# Patient Record
Sex: Female | Born: 1970 | Race: White | Hispanic: No | Smoking: Never smoker
Health system: Southern US, Community
[De-identification: ages and names within clinical notes are randomized; demographics above are authoritative.]

## PROBLEM LIST (undated history)

## (undated) DIAGNOSIS — O24419 Gestational diabetes mellitus in pregnancy, unspecified control: Secondary | ICD-10-CM

## (undated) DIAGNOSIS — K219 Gastro-esophageal reflux disease without esophagitis: Secondary | ICD-10-CM

## (undated) DIAGNOSIS — J309 Allergic rhinitis, unspecified: Secondary | ICD-10-CM

## (undated) DIAGNOSIS — R7302 Impaired glucose tolerance (oral): Secondary | ICD-10-CM

## (undated) DIAGNOSIS — E785 Hyperlipidemia, unspecified: Secondary | ICD-10-CM

## (undated) DIAGNOSIS — R000 Tachycardia, unspecified: Secondary | ICD-10-CM

## (undated) DIAGNOSIS — F411 Generalized anxiety disorder: Secondary | ICD-10-CM

## (undated) HISTORY — DX: Impaired glucose tolerance (oral): R73.02

## (undated) HISTORY — DX: Allergic rhinitis, unspecified: J30.9

## (undated) HISTORY — DX: Hyperlipidemia, unspecified: E78.5

## (undated) HISTORY — PX: TONSILLECTOMY AND ADENOIDECTOMY: SUR1326

## (undated) HISTORY — DX: Generalized anxiety disorder: F41.1

## (undated) HISTORY — DX: Gestational diabetes mellitus in pregnancy, unspecified control: O24.419

## (undated) HISTORY — DX: Gastro-esophageal reflux disease without esophagitis: K21.9

## (undated) HISTORY — DX: Tachycardia, unspecified: R00.0

---

## 1991-01-22 HISTORY — PX: NASAL SINUS SURGERY: SHX719

## 1998-11-30 ENCOUNTER — Ambulatory Visit (HOSPITAL_COMMUNITY): Admission: RE | Admit: 1998-11-30 | Discharge: 1998-11-30 | Payer: Self-pay | Admitting: Internal Medicine

## 1998-11-30 ENCOUNTER — Encounter: Payer: Self-pay | Admitting: Internal Medicine

## 1999-10-31 ENCOUNTER — Other Ambulatory Visit: Admission: RE | Admit: 1999-10-31 | Discharge: 1999-10-31 | Payer: Self-pay | Admitting: Internal Medicine

## 2004-06-19 ENCOUNTER — Ambulatory Visit: Payer: Self-pay | Admitting: Internal Medicine

## 2004-10-02 ENCOUNTER — Ambulatory Visit: Payer: Self-pay | Admitting: Internal Medicine

## 2004-11-19 ENCOUNTER — Ambulatory Visit: Payer: Self-pay | Admitting: Internal Medicine

## 2005-08-05 ENCOUNTER — Ambulatory Visit: Payer: Self-pay | Admitting: Internal Medicine

## 2006-01-27 ENCOUNTER — Encounter: Admission: RE | Admit: 2006-01-27 | Discharge: 2006-04-04 | Payer: Self-pay | Admitting: *Deleted

## 2006-04-05 ENCOUNTER — Inpatient Hospital Stay (HOSPITAL_COMMUNITY): Admission: AD | Admit: 2006-04-05 | Discharge: 2006-04-08 | Payer: Self-pay | Admitting: Obstetrics & Gynecology

## 2006-04-05 ENCOUNTER — Encounter (INDEPENDENT_AMBULATORY_CARE_PROVIDER_SITE_OTHER): Payer: Self-pay | Admitting: Specialist

## 2006-04-09 ENCOUNTER — Encounter: Admission: RE | Admit: 2006-04-09 | Discharge: 2006-05-09 | Payer: Self-pay | Admitting: *Deleted

## 2006-05-10 ENCOUNTER — Encounter: Admission: RE | Admit: 2006-05-10 | Discharge: 2006-05-21 | Payer: Self-pay | Admitting: *Deleted

## 2006-09-01 ENCOUNTER — Ambulatory Visit: Payer: Self-pay | Admitting: Internal Medicine

## 2006-09-01 DIAGNOSIS — E669 Obesity, unspecified: Secondary | ICD-10-CM | POA: Insufficient documentation

## 2006-09-01 DIAGNOSIS — J309 Allergic rhinitis, unspecified: Secondary | ICD-10-CM | POA: Insufficient documentation

## 2006-09-01 DIAGNOSIS — F411 Generalized anxiety disorder: Secondary | ICD-10-CM

## 2006-09-01 DIAGNOSIS — E739 Lactose intolerance, unspecified: Secondary | ICD-10-CM

## 2006-09-01 DIAGNOSIS — R519 Headache, unspecified: Secondary | ICD-10-CM | POA: Insufficient documentation

## 2006-09-01 DIAGNOSIS — R51 Headache: Secondary | ICD-10-CM

## 2006-09-01 LAB — CONVERTED CEMR LAB
ALT: 18 units/L (ref 0–35)
AST: 18 units/L (ref 0–37)
Albumin: 3.9 g/dL (ref 3.5–5.2)
Alkaline Phosphatase: 58 units/L (ref 39–117)
BUN: 8 mg/dL (ref 6–23)
Basophils Absolute: 0.1 10*3/uL (ref 0.0–0.1)
Basophils Relative: 0.8 % (ref 0.0–1.0)
Bilirubin Urine: NEGATIVE
Bilirubin, Direct: 0.1 mg/dL (ref 0.0–0.3)
CO2: 32 meq/L (ref 19–32)
Calcium: 9.5 mg/dL (ref 8.4–10.5)
Chloride: 101 meq/L (ref 96–112)
Cholesterol: 228 mg/dL (ref 0–200)
Creatinine, Ser: 0.5 mg/dL (ref 0.4–1.2)
Direct LDL: 142.5 mg/dL
Eosinophils Absolute: 0.7 10*3/uL — ABNORMAL HIGH (ref 0.0–0.6)
Eosinophils Relative: 6.7 % — ABNORMAL HIGH (ref 0.0–5.0)
GFR calc Af Amer: 180 mL/min
GFR calc non Af Amer: 148 mL/min
Glucose, Bld: 103 mg/dL — ABNORMAL HIGH (ref 70–99)
HCT: 40.3 % (ref 36.0–46.0)
HDL: 52.4 mg/dL (ref >39.0)
Hemoglobin, Urine: NEGATIVE
Hemoglobin: 13.8 g/dL (ref 12.0–15.0)
Ketones, ur: NEGATIVE mg/dL
Leukocytes, UA: NEGATIVE
Lymphocytes Relative: 20.3 % (ref 12.0–46.0)
MCHC: 34.3 g/dL (ref 30.0–36.0)
MCV: 86.3 fL (ref 78.0–100.0)
Monocytes Absolute: 0.8 10*3/uL — ABNORMAL HIGH (ref 0.2–0.7)
Monocytes Relative: 8.1 % (ref 3.0–11.0)
Neutro Abs: 6.3 10*3/uL (ref 1.4–7.7)
Neutrophils Relative %: 64.1 % (ref 43.0–77.0)
Nitrite: NEGATIVE
Platelets: 342 10*3/uL (ref 150–400)
Potassium: 3.6 meq/L (ref 3.5–5.1)
RBC: 4.67 M/uL (ref 3.87–5.11)
RDW: 12.3 % (ref 11.5–14.6)
Sodium: 141 meq/L (ref 135–145)
Specific Gravity, Urine: 1.015 (ref 1.000–1.03)
TSH: 1.44 microintl units/mL (ref 0.35–5.50)
Total Bilirubin: 0.6 mg/dL (ref 0.3–1.2)
Total CHOL/HDL Ratio: 4.4
Total Protein, Urine: NEGATIVE mg/dL
Total Protein: 7.2 g/dL (ref 6.0–8.3)
Triglycerides: 135 mg/dL (ref 0–149)
Urine Glucose: NEGATIVE mg/dL
Urobilinogen, UA: 0.2 (ref 0.0–1.0)
VLDL: 27 mg/dL (ref 0–40)
WBC: 9.9 10*3/uL (ref 4.5–10.5)
pH: 6.5 (ref 5.0–8.0)

## 2006-09-08 ENCOUNTER — Ambulatory Visit: Payer: Self-pay | Admitting: Internal Medicine

## 2006-10-27 ENCOUNTER — Encounter: Admission: RE | Admit: 2006-10-27 | Discharge: 2006-10-27 | Payer: Self-pay | Admitting: *Deleted

## 2006-11-20 ENCOUNTER — Encounter: Payer: Self-pay | Admitting: Internal Medicine

## 2006-11-20 ENCOUNTER — Ambulatory Visit: Payer: Self-pay | Admitting: Internal Medicine

## 2006-11-20 DIAGNOSIS — H6690 Otitis media, unspecified, unspecified ear: Secondary | ICD-10-CM | POA: Insufficient documentation

## 2006-11-20 DIAGNOSIS — J019 Acute sinusitis, unspecified: Secondary | ICD-10-CM

## 2007-03-19 ENCOUNTER — Ambulatory Visit: Payer: Self-pay | Admitting: Internal Medicine

## 2007-04-09 ENCOUNTER — Ambulatory Visit: Payer: Self-pay | Admitting: Internal Medicine

## 2007-04-09 DIAGNOSIS — E785 Hyperlipidemia, unspecified: Secondary | ICD-10-CM | POA: Insufficient documentation

## 2007-04-09 LAB — CONVERTED CEMR LAB: Hgb A1c MFr Bld: 5.8 % (ref 4.6–6.0)

## 2007-06-09 ENCOUNTER — Encounter: Payer: Self-pay | Admitting: Internal Medicine

## 2007-07-01 ENCOUNTER — Ambulatory Visit: Payer: Self-pay | Admitting: Internal Medicine

## 2007-07-01 DIAGNOSIS — R002 Palpitations: Secondary | ICD-10-CM

## 2007-07-09 ENCOUNTER — Telehealth: Payer: Self-pay | Admitting: Internal Medicine

## 2007-08-10 ENCOUNTER — Ambulatory Visit: Payer: Self-pay | Admitting: Internal Medicine

## 2007-10-12 ENCOUNTER — Ambulatory Visit: Payer: Self-pay | Admitting: Internal Medicine

## 2007-10-29 ENCOUNTER — Encounter: Admission: RE | Admit: 2007-10-29 | Discharge: 2007-10-29 | Payer: Self-pay | Admitting: Obstetrics & Gynecology

## 2007-11-06 ENCOUNTER — Telehealth (INDEPENDENT_AMBULATORY_CARE_PROVIDER_SITE_OTHER): Payer: Self-pay | Admitting: *Deleted

## 2007-11-16 ENCOUNTER — Ambulatory Visit: Payer: Self-pay | Admitting: Internal Medicine

## 2008-05-30 ENCOUNTER — Ambulatory Visit: Payer: Self-pay | Admitting: Internal Medicine

## 2008-06-21 LAB — CONVERTED CEMR LAB: Pap Smear: NORMAL

## 2008-07-26 ENCOUNTER — Ambulatory Visit: Payer: Self-pay | Admitting: Internal Medicine

## 2008-07-26 LAB — CONVERTED CEMR LAB
Albumin: 3.7 g/dL (ref 3.5–5.2)
BUN: 7 mg/dL (ref 6–23)
Basophils Relative: 0.6 % (ref 0.0–3.0)
Bilirubin, Direct: 0.1 mg/dL (ref 0.0–0.3)
CO2: 30 meq/L (ref 19–32)
Chloride: 105 meq/L (ref 96–112)
Cholesterol: 200 mg/dL (ref 0–200)
Creatinine, Ser: 0.6 mg/dL (ref 0.4–1.2)
Eosinophils Absolute: 0.4 10*3/uL (ref 0.0–0.7)
Hemoglobin, Urine: NEGATIVE
Leukocytes, UA: NEGATIVE
MCHC: 35.2 g/dL (ref 30.0–36.0)
MCV: 86.9 fL (ref 78.0–100.0)
Monocytes Absolute: 0.5 10*3/uL (ref 0.1–1.0)
Neutrophils Relative %: 62.9 % (ref 43.0–77.0)
Nitrite: NEGATIVE
RBC: 4.28 M/uL (ref 3.87–5.11)
TSH: 1.6 microintl units/mL (ref 0.35–5.50)
Total CHOL/HDL Ratio: 4
Total Protein, Urine: NEGATIVE mg/dL
Total Protein: 7.1 g/dL (ref 6.0–8.3)
Triglycerides: 61 mg/dL (ref 0.0–149.0)
Urobilinogen, UA: 0.2 (ref 0.0–1.0)

## 2008-08-01 ENCOUNTER — Ambulatory Visit: Payer: Self-pay | Admitting: Internal Medicine

## 2008-08-01 DIAGNOSIS — R079 Chest pain, unspecified: Secondary | ICD-10-CM | POA: Insufficient documentation

## 2008-08-12 ENCOUNTER — Ambulatory Visit: Payer: Self-pay | Admitting: Internal Medicine

## 2008-08-16 ENCOUNTER — Telehealth (INDEPENDENT_AMBULATORY_CARE_PROVIDER_SITE_OTHER): Payer: Self-pay | Admitting: *Deleted

## 2008-08-31 ENCOUNTER — Ambulatory Visit: Payer: Self-pay | Admitting: Internal Medicine

## 2008-10-31 ENCOUNTER — Encounter: Admission: RE | Admit: 2008-10-31 | Discharge: 2008-10-31 | Payer: Self-pay | Admitting: Obstetrics & Gynecology

## 2009-02-15 ENCOUNTER — Telehealth: Payer: Self-pay | Admitting: Internal Medicine

## 2009-02-16 ENCOUNTER — Ambulatory Visit: Payer: Self-pay | Admitting: Internal Medicine

## 2009-02-16 DIAGNOSIS — K644 Residual hemorrhoidal skin tags: Secondary | ICD-10-CM | POA: Insufficient documentation

## 2009-11-01 ENCOUNTER — Encounter: Admission: RE | Admit: 2009-11-01 | Discharge: 2009-11-01 | Payer: Self-pay | Admitting: Obstetrics & Gynecology

## 2010-01-10 ENCOUNTER — Ambulatory Visit: Payer: Self-pay | Admitting: Internal Medicine

## 2010-01-16 LAB — CONVERTED CEMR LAB
ALT: 12 units/L (ref 0–35)
AST: 14 units/L (ref 0–37)
Basophils Absolute: 0 10*3/uL (ref 0.0–0.1)
Bilirubin, Direct: 0.1 mg/dL (ref 0.0–0.3)
Blood, UA: NEGATIVE
Chloride: 105 meq/L (ref 96–112)
Creatinine, Ser: 0.5 mg/dL (ref 0.4–1.2)
Eosinophils Absolute: 0.5 10*3/uL (ref 0.0–0.7)
GFR calc non Af Amer: 133.27 mL/min (ref >60.00)
HDL: 43.9 mg/dL (ref >39.00)
Lymphocytes Relative: 25.1 % (ref 12.0–46.0)
MCHC: 34.7 g/dL (ref 30.0–36.0)
Neutrophils Relative %: 61.6 % (ref 43.0–77.0)
Potassium: 3.8 meq/L (ref 3.5–5.1)
RDW: 12.8 % (ref 11.5–14.6)
TSH: 1.64 microintl units/mL (ref 0.35–5.50)
Total Bilirubin: 0.4 mg/dL (ref 0.3–1.2)
Total Protein, Urine: NEGATIVE mg/dL
Triglycerides: 61 mg/dL (ref 0.0–149.0)
Urine Glucose: NEGATIVE mg/dL
VLDL: 12.2 mg/dL (ref 0.0–40.0)
pH: 6.5 (ref 5.0–8.0)

## 2010-01-17 ENCOUNTER — Ambulatory Visit: Payer: Self-pay | Admitting: Internal Medicine

## 2010-01-17 DIAGNOSIS — K219 Gastro-esophageal reflux disease without esophagitis: Secondary | ICD-10-CM

## 2010-02-20 NOTE — Progress Notes (Signed)
Summary: Stool sample  Phone Note Call from Patient   Caller: Patient [phone] Summary of Call: pt called stating that she has appt with MD tomorrow for rectal bleeding. pt wants to know if MD wants her to have a stool sample done prior to appt? Initial call taken by: Margaret Pyle, CMA,  February 15, 2009 3:35 PM  Follow-up for Phone Call        pt called back and I advised her that MD could  put in order for her to pick up specimen cup, but given the time of day she may not be able to get her in time. pt agreed and stated that she will get cup if MD okay'd it at [facility] tomorrow Follow-up by: Margaret Pyle, CMA,  February 15, 2009 4:08 PM

## 2010-02-20 NOTE — Assessment & Plan Note (Signed)
Summary: rectal bleed/leaving triage message/cd   Vital Signs:  Patient profile:   40 year old female Height:      60 inches Weight:      166 pounds BMI:     32.54 O2 Sat:      98 % on Room air Temp:     97.6 degrees F oral Pulse rate:   84 / minute BP sitting:   100 / 76  (left arm) Cuff size:   regular  Vitals Entered ByZella Ball Ewing (February 16, 2009 4:07 PM)  O2 Flow:  Room air CC: rectal bleeding/RE   Primary Care Provider:  Corwin Levins MD  CC:  rectal bleeding/RE.  History of Present Illness: here with 4 days recurrent small volume BRBPR (not menstrual blood) with blood on tissue and bowl but not clear if in stool;  seemed to start with an episode of loose stool, but has o/w been normal stool consistency since;  has had some anal discomfort and mild aching that has been persistent, but no other new  back pain, abd pain, n/v, fever, chills, recent wt loss, joint pains, dizziness or orthostasis.Marland Kitchen LMP now.  No GU symptoms such as dysuria or frequency.  No prior hx GI problems or BRBPR.  denies recent constipation or straining.  No other overt bleeding such as bruising, urine, oral, and no recnet asa use.  No recent anemia - last hgb normal july 2010.    Problems Prior to Update: 1)  Hematochezia  (ICD-578.1) 2)  External Hemorrhoids  (ICD-455.3) 3)  Chest Pain  (ICD-786.50) 4)  Preventive Health Care  (ICD-V70.0) 5)  Sinusitis- Acute-nos  (ICD-461.9) 6)  Palpitations, Recurrent  (ICD-785.1) 7)  Hyperlipidemia  (ICD-272.4) 8)  Sinusitis- Acute-nos  (ICD-461.9) 9)  Otitis Media, Acute, Left  (ICD-382.9) 10)  Family History Breast Cancer 1st Degree Relative <50  (ICD-V16.3) 11)  Abnormal Findings, Elevated Bp w/o Htn  (ICD-796.2) 12)  Glucose Intolerance  (ICD-271.3) 13)  Obesity  (ICD-278.00) 14)  Headache  (ICD-784.0) 15)  Anxiety  (ICD-300.00) 16)  Allergic Rhinitis  (ICD-477.9)  Medications Prior to Update: 1)  Metoprolol Tartrate 25 Mg Tabs (Metoprolol Tartrate)  .... Take 1/2 Tab Two Times A Day 2)  Tussionex Pennkinetic Er 8-10 Mg/71ml Lqcr (Chlorpheniramine-Hydrocodone) .... 2.5-5 Ml By Mouth Two Times A Day As Needed Cough 3)  Blood Pressure Monitor .... Use Asd 4)  Alprazolam 0.25 Mg Tabs (Alprazolam) .Marland Kitchen.. 1 By Mouth Once Daily As Needed  Current Medications (verified): 1)  Metoprolol Tartrate 25 Mg Tabs (Metoprolol Tartrate) .... Take 1/2 Tab Two Times A Day 2)  Tussionex Pennkinetic Er 8-10 Mg/42ml Lqcr (Chlorpheniramine-Hydrocodone) .... 2.5-5 Ml By Mouth Two Times A Day As Needed Cough 3)  Blood Pressure Monitor .... Use Asd 4)  Alprazolam 0.25 Mg Tabs (Alprazolam) .Marland Kitchen.. 1 By Mouth Once Daily As Needed 5)  Hydrocortisone Ace-Pramoxine 2.5-1 % Crea (Hydrocortisone Ace-Pramoxine) .... Use Asd Two Times A Day As Needed To Affected Area  Allergies (verified): 1)  ! Sulfa 2)  ! Cipro  Past History:  Past Medical History: Last updated: 08/01/2008 Allergic rhinitis Anxiety Headache Obesity Glucose Intolerance Increased blood pressure without HTN Hyperlipidemia palpitations  probable benign ovary cyst - followed per GYN - due for f/u u/s dec 2010  Past Surgical History: Last updated: 04/09/2007 Tonsillectomy- 1982 Septoplasty- 1993 Tubal ligation  Social History: Last updated: 04/09/2007 Never Smoked Alcohol use-no Married 2 biol children  Risk Factors: Smoking Status: never (11/20/2006)  Family History: Reviewed  history from 11/20/2006 and no changes required. Family History Breast cancer 1st degree relative <50 no colon cancer  Review of Systems       all otherwise negative per pt -  Physical Exam  General:  alert and overweight-appearing.  , not ill appearing Head:  normocephalic and atraumatic.   Eyes:  vision grossly intact, pupils equal, and pupils round.   Ears:  R ear normal and L ear normal.   Nose:  no external deformity and no nasal discharge.   Mouth:  no gingival abnormalities and pharynx pink and  moist.   Neck:  supple and no masses.   Lungs:  normal respiratory effort and normal breath sounds.   Heart:  normal rate and regular rhythm.   Rectal:  new anal ext hemorrhoid noted 9oclock,  no active bleeding, no fissure, small tag noted as well approx 6oclock, heme neg  Msk:  no joint tenderness and no joint swelling.   Extremities:  no edema, no erythema    Impression & Recommendations:  Problem # 1:  EXTERNAL HEMORRHOIDS (ICD-455.3) tx with steroid cream as needed use, non thrombosed at this time  Problem # 2:  HEMATOCHEZIA (ICD-578.1)  most likely from above, but will need direct sched colonosocpy to r/o malignancy or other  Orders: Gastroenterology Referral (GI)  Complete Medication List: 1)  Metoprolol Tartrate 25 Mg Tabs (Metoprolol tartrate) .... Take 1/2 tab two times a day 2)  Tussionex Pennkinetic Er 8-10 Mg/46ml Lqcr (Chlorpheniramine-hydrocodone) .... 2.5-5 ml by mouth two times a day as needed cough 3)  Blood Pressure Monitor  .... Use asd 4)  Alprazolam 0.25 Mg Tabs (Alprazolam) .Marland Kitchen.. 1 by mouth once daily as needed 5)  Hydrocortisone Ace-pramoxine 2.5-1 % Crea (Hydrocortisone ace-pramoxine) .... Use asd two times a day as needed to affected area  Patient Instructions: 1)  Please take all new medications as prescribed 2)  Continue all previous medications as before this visit  3)  You will be contacted about the referral(s) to: colonoscopy 4)  Please schedule a follow-up appointment in July 2011 with CPX labs Prescriptions: HYDROCORTISONE ACE-PRAMOXINE 2.5-1 % CREA (HYDROCORTISONE ACE-PRAMOXINE) use asd two times a day as needed to affected area  #1 x 1   Entered and Authorized by:   Corwin Levins MD   Signed by:   Corwin Levins MD on 02/16/2009   Method used:   Print then Give to Patient   RxID:   [id number]

## 2010-02-22 NOTE — Assessment & Plan Note (Signed)
Summary: CPX / NWS   Vital Signs:  Patient profile:   40 year old female Height:      61 inches Weight:      165.38 pounds BMI:     31.36 O2 Sat:      97 % on Room air Temp:     97.9 degrees F oral Pulse rate:   97 / minute BP sitting:   100 / 62  (left arm) Cuff size:   regular  Vitals Entered By: Zella Ball Ewing CMA Duncan Dull) (January 17, 2010 8:21 AM)  O2 Flow:  Room air  Preventive Care Screening  Pap Smear:    Date:  06/21/2009    Results:  normal      had flu shot earlier this season   CC: ADult Physical/RE   Primary Care Abeni Finchum:  Corwin Levins MD  CC:  ADult Physical/RE.  History of Present Illness: here for wellness, overall doing ok;  Pt denies CP, worsening sob, doe, wheezing, orthopnea, pnd, worsening LE edema, palps, dizziness or syncope  Pt denies new neuro symptoms such as headache, facial or extremity weakness  Pt denies polydipsia, polyuria   Overall good compliance with meds, trying to follow low chol diet, wt stable, little excercise however.  No fever, unintentional wt loss, night sweats, loss of appetite or other constitutional symptoms  Overall good compliance with meds, and good tolerability.  Pt states good ability with ADL's, low fall risk, home safety reviewed and adequate, no significant change in hearing or vision, trying to follow lower chol diet, and occasionally active only with regular excercise.  Lost 4 lbs since last yr;  more stress with separation and upcoming divorce;   Denies worsening depressive symptoms, suicidal ideation, or panic.  She is concerned that has had achy symtpoms in past yr - mostly to the hands and shoulder;  grandmother with RA; there was some concern  about lupus in the past but no dx;  she declines labs today since it is very mild and no swelling.    Preventive Screening-Counseling & Management      Drug Use:  no.    Problems Prior to Update: 1)  Hematochezia  (ICD-578.1) 2)  External Hemorrhoids  (ICD-455.3) 3)  Chest  Pain  (ICD-786.50) 4)  Preventive Health Care  (ICD-V70.0) 5)  Sinusitis- Acute-nos  (ICD-461.9) 6)  Palpitations, Recurrent  (ICD-785.1) 7)  Hyperlipidemia  (ICD-272.4) 8)  Sinusitis- Acute-nos  (ICD-461.9) 9)  Otitis Media, Acute, Left  (ICD-382.9) 10)  Family History Breast Cancer 1st Degree Relative <50  (ICD-V16.3) 11)  Abnormal Findings, Elevated Bp w/o Htn  (ICD-796.2) 12)  Glucose Intolerance  (ICD-271.3) 13)  Obesity  (ICD-278.00) 14)  Headache  (ICD-784.0) 15)  Anxiety  (ICD-300.00) 16)  Allergic Rhinitis  (ICD-477.9)  Medications Prior to Update: 1)  Metoprolol Tartrate 25 Mg Tabs (Metoprolol Tartrate) .... Take 1/2 Tab Two Times A Day 2)  Tussionex Pennkinetic Er 8-10 Mg/73ml Lqcr (Chlorpheniramine-Hydrocodone) .... 2.5-5 Ml By Mouth Two Times A Day As Needed Cough 3)  Blood Pressure Monitor .... Use Asd 4)  Alprazolam 0.25 Mg Tabs (Alprazolam) .Marland Kitchen.. 1 By Mouth Once Daily As Needed 5)  Hydrocortisone Ace-Pramoxine 2.5-1 % Crea (Hydrocortisone Ace-Pramoxine) .... Use Asd Two Times A Day As Needed To Affected Area  Current Medications (verified): 1)  Metoprolol Tartrate 25 Mg Tabs (Metoprolol Tartrate) .... Take 1/2 Tab Two Times A Day  Allergies (verified): 1)  ! Sulfa 2)  ! 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 Family History Breast cancer 1st degree relative <50 no colon cancer pateranl side - "arthritis"    Social History: Reviewed history from 04/09/2007 and no changes required. Never Smoked Alcohol use-no Married but divorcing 2012 2 biol children Drug use-no work - Runner, broadcasting/film/video - 6th grade language artsDrug Use:  no  Review of Systems  The patient denies anorexia, fever, vision loss, decreased hearing, hoarseness, chest pain, syncope, dyspnea on exertion, peripheral edema, prolonged cough, headaches, hemoptysis, abdominal pain, melena, hematochezia, severe indigestion/heartburn, hematuria, muscle weakness, suspicious skin lesions, transient blindness, difficulty walking, depression, unusual weight change, abnormal bleeding, enlarged lymph nodes, and angioedema.         all otherwise negative per pt -    Physical Exam  General:  alert and overweight-appearing.   Head:  normocephalic and atraumatic.   Eyes:  vision grossly intact, pupils equal, and pupils round.   Ears:  R ear normal and L ear normal.   Nose:  no external deformity and no nasal discharge.   Mouth:  no gingival abnormalities and pharynx pink and moist.   Neck:  supple and no masses.   Lungs:  normal respiratory effort and normal breath sounds.   Heart:  normal rate and regular rhythm.   Abdomen:  soft, non-tender, and normal bowel sounds.   Msk:  no joint tenderness and no joint swelling.   Extremities:  no edema, no erythema  Neurologic:  strength normal in all extremities, sensation intact to light touch, gait normal, and DTRs symmetrical and normal.   Skin:  color normal and no rashes.   Psych:  not depressed appearing and slightly anxious.     Impression & Recommendations:  Problem # 1:  Preventive Health Care (ICD-V70.0) Overall doing well, age appropriate education and counseling updated, referral for  preventive services and immunizations addressed, dietary counseling and smoking status adressed , most recent labs reviewed I have personally reviewed and have noted 1.The patient's medical and social history 2.Their use of alcohol, tobacco or illicit drugs 3.Their current medications and supplements 4. Functional ability including ADL's, fall risk, home safety risk, hearing & visual impairment  5.Diet and physical activities 6.Evidence for depression or mood disorders The patients weight, height, BMI  have been recorded in the chart I have made referrals, counseling and provided education to the patient based review of the above   Problem # 2:  HYPERLIPIDEMIA (ICD-272.4)  Labs Reviewed: SGOT: 14 (01/16/2010)   SGPT: 12 (01/16/2010)   HDL:43.90 (01/16/2010), 55.80 (07/26/2008)  LDL:125 (01/16/2010), 132 (07/26/2008)  Chol:181 (01/16/2010), 200 (07/26/2008)  Trig:61.0 (01/16/2010), 61.0 (07/26/2008) d/w pt - declines statin, to follow lower chol diet  Problem # 3:  PAIN IN JOINT, MULTIPLE SITES (ICD-719.49) exam benign, ok for tylenol as needed   Complete Medication List: 1)  Metoprolol Tartrate 25 Mg Tabs (Metoprolol tartrate) .Marland KitchenMarland KitchenMarland Kitchen  Take 1/2 tab two times a day  Patient Instructions: 1)  you are given the refill today 2)  You are otherwise up to date 3)  Please follow lower cholesterol diet 4)  Please schedule a follow-up appointment in 1 year, or sooner if needed Prescriptions: METOPROLOL TARTRATE 25 MG TABS (METOPROLOL TARTRATE) take 1/2 tab two times a day  #90 x 3   Entered and Authorized by:   Corwin Levins MD   Signed by:   Corwin Levins MD on 01/17/2010   Method used:   Print then Give to Patient   RxID:   [id number]    Orders Added: 1)  Est. Patient 18-39 years [[id number]]

## 2010-06-08 NOTE — Discharge Summary (Signed)
NAMELAKECHIA, [name]                ACCOUNT NO.:  [id number]   MEDICAL RECORD NO.:  [id number]          PATIENT TYPE:  INP   LOCATION:  9123                          FACILITY:  [facility]   PHYSICIAN:  Genia Del, M.D.DATE OF BIRTH:  15-Nov-1970   DATE OF ADMISSION:  04/05/2006  DATE OF DISCHARGE:  04/08/2006                               DISCHARGE SUMMARY   ADMISSION DIAGNOSES:  1. Thirty-nine plus weeks gestation.  2. Spontaneous labor.  3. Previous cesarean section.  4. Desire for bilateral tubal sterilization.   DISCHARGE DIAGNOSES:  1. Thirty-nine plus weeks gestation.  2. Spontaneous labor.  3. Previous cesarean section.  4. Desire for bilateral tubal sterilization.   INTERVENTION:  Repeat urgent low transverse C-section and bilateral  tubal sterilization by modified Pomeroy technique.   HOSPITAL COURSE:  The patient was admitted in spontaneous labor.  Because of a previous C-section, the decision was made to proceed with a  repeat urgent C-section.  She also desired definitive contraception so a  bilateral tubal sterilization was done by modified Pomeroy technique.  The surgery went without problems.  She had a baby girl, Apgars 9 and  10, weight 8 pounds 14 ounces.  The estimated blood loss was 600 mL and  she received Ancef after cord clamping.  The postop evaluation was  unremarkable.  She remained afebrile and hemodynamically stable.  Her  postop hemoglobin was 9.6.  She was discharged on postop day #3 in  stable status.  Postop advice was given.  She will follow up at [facility] in 6 weeks.  She will use ibuprofen p.r.n.      Genia Del, M.D.  Electronically Signed     ML/MEDQ  D:  05/04/2006  T:  05/04/2006  Job:  [id number]

## 2010-06-08 NOTE — Op Note (Signed)
[name], [name]                ACCOUNT NO.:  [id number]   MEDICAL RECORD NO.:  [id number]          PATIENT TYPE:  MAT   LOCATION:  [facility]                          FACILITY:  [facility]   PHYSICIAN:  Genia Del, M.D.DATE OF BIRTH:  [birth year]   DATE OF PROCEDURE:  04/05/2006  DATE OF DISCHARGE:                               OPERATIVE REPORT   PREOPERATIVE DIAGNOSIS:  Thirty-nine plus weeks' gestation, spontaneous  labor with spontaneous rupture of membranes, previous C-section, desire  for bilateral tubal sterilization.   POSTOPERATIVE DIAGNOSIS:  Thirty-nine plus weeks' gestation, spontaneous  labor with spontaneous rupture of membranes, previous C-section, desire  for bilateral tubal sterilization.   PROCEDURE:  Repeat urgent low transverse C-section and bilateral tubal  sterilization by modified Pomeroy technique.   SURGEON:  Dr. Genia Del.   ANESTHESIOLOGIST:  Dr. Harvest Forest.   PROCEDURE:  Under spinal anesthesia, the patient is in 15-degree left  decubitus position.  She is prepped with Betadine on the abdominal,  suprapubic and vulvar areas.  The Foley is inserted, and the patient is  draped as usual.  The level of anesthesia is verified and is adequate.  An infiltration of Marcaine one-quarter plain 20 mL is done at the site  of the previous C-section.  We then make a Pfannenstiel incision  following the scar of the previous C-section.  We opened the aponeurosis  transversely with Mayo scissors and the electrocautery.  We separated  the recti muscles from the aponeurosis on the midline.  We opened the  parietal peritoneum longitudinally with [facility] scissors.  We then open the  visceral peritoneum over the lower uterine segment with [facility] scissors  transversely and reclined the bladder downward.  The bladder retractor  is put in place.  We then make a low transverse hysterotomy over the  lower uterine segment with the scalpel and extend on each side with  dressing  scissors.  The amniotic fluid is clear.  The fetus is in  cephalic presentation.  Birth of a baby girl at 4:20.  The cord is  clamped and cut.  The baby was given to the neonatal team.  Apgars are 9  and 10.  The weight is 8 pounds 14 ounces.  Cord blood donation is done.  We remove the placenta which is complete with three vessels.  It is sent  to labor and delivery.  A dose of Ancef 1 g IV is given.  Pitocin is  started in the IV fluid.  Revision of the intrauterine cavity, the  uterus contracts well.  We close the hysterotomy with a first locked  running suture of Vicryl 0.  A mattress stitch is done with Vicryl 0.  A  small extension was present on the left inferior aspect, and it was  repaired in 2 layers.  Hemostasis is adequate.  We then verify the tubes  and ovaries; they are normal to inspection.  The bilateral tubal  sterilization is done by a modified Pomeroy technique.  We make a window  in the mesosalpinx on the right side.  We attach with plain suture and  cut a segment of tube in the middle with [employer].  It is sent to  pathology.  We cauterize the tips of the cut tube on the right side.  Hemostasis is completed with a Vicryl 4-0 at the proximal end of the  tube.  We proceed exactly the same way on the left side.  Hemostasis is  adequate on that side as well.  We then irrigate and suction the  abdominopelvic cavity.  We complete hemostasis on the recti muscles with  the electrocautery.  We then close the aponeurosis with 2 half running  sutures of Vicryl 0.  Hemostasis is completed on the adipose tissue with  the  electrocautery, and the skin is reapproximated with staples.  A  compressive dressing is made.  The estimated blood loss is 600 mL.  The  count of sponges and instruments was complete x2.  The patient is  brought to recovery room in good stable status.      Genia Del, M.D.  Electronically Signed     ML/MEDQ  D:  04/05/2006  T:  04/05/2006   Job:  [id number]

## 2010-07-24 ENCOUNTER — Other Ambulatory Visit: Payer: Self-pay | Admitting: Obstetrics & Gynecology

## 2010-09-26 ENCOUNTER — Other Ambulatory Visit: Payer: Self-pay | Admitting: Obstetrics & Gynecology

## 2010-09-26 DIAGNOSIS — Z1231 Encounter for screening mammogram for malignant neoplasm of breast: Secondary | ICD-10-CM

## 2010-11-08 ENCOUNTER — Ambulatory Visit: Payer: Self-pay

## 2010-11-27 ENCOUNTER — Ambulatory Visit
Admission: RE | Admit: 2010-11-27 | Discharge: 2010-11-27 | Disposition: A | Discharge status: Home / Self Care | Payer: BC Managed Care – PPO | Source: Ambulatory Visit | Attending: Obstetrics & Gynecology | Admitting: Obstetrics & Gynecology

## 2010-11-27 DIAGNOSIS — Z1231 Encounter for screening mammogram for malignant neoplasm of breast: Secondary | ICD-10-CM

## 2010-12-03 ENCOUNTER — Encounter: Payer: Self-pay | Admitting: Internal Medicine

## 2010-12-03 ENCOUNTER — Ambulatory Visit (INDEPENDENT_AMBULATORY_CARE_PROVIDER_SITE_OTHER): Payer: BC Managed Care – PPO | Admitting: Internal Medicine

## 2010-12-03 VITALS — BP 110/72 | HR 101 | Temp 98.1°F | Ht 61.0 in | Wt 172.4 lb

## 2010-12-03 DIAGNOSIS — J019 Acute sinusitis, unspecified: Secondary | ICD-10-CM

## 2010-12-03 DIAGNOSIS — E119 Type 2 diabetes mellitus without complications: Secondary | ICD-10-CM | POA: Insufficient documentation

## 2010-12-03 DIAGNOSIS — E739 Lactose intolerance, unspecified: Secondary | ICD-10-CM

## 2010-12-03 DIAGNOSIS — E1165 Type 2 diabetes mellitus with hyperglycemia: Secondary | ICD-10-CM | POA: Insufficient documentation

## 2010-12-03 DIAGNOSIS — R7302 Impaired glucose tolerance (oral): Secondary | ICD-10-CM

## 2010-12-03 DIAGNOSIS — Z0000 Encounter for general adult medical examination without abnormal findings: Secondary | ICD-10-CM

## 2010-12-03 DIAGNOSIS — F411 Generalized anxiety disorder: Secondary | ICD-10-CM

## 2010-12-03 DIAGNOSIS — R7309 Other abnormal glucose: Secondary | ICD-10-CM

## 2010-12-03 MED ORDER — LEVOFLOXACIN 500 MG PO TABS: 500.0000 mg | ORAL_TABLET | Freq: Every day | ORAL | Status: AC

## 2010-12-03 NOTE — Assessment & Plan Note (Signed)
asympt - for a1c check next visit  Lab Results  Component Value Date   HGBA1C 5.8 04/09/2007

## 2010-12-03 NOTE — Assessment & Plan Note (Addendum)
Asympt, to follow symptoms of polys with acute infection,  to f/u any worsening symptoms or concerns  Lab Results  Component Value Date   HGBA1C 5.8 04/09/2007

## 2010-12-03 NOTE — Patient Instructions (Addendum)
Take all new medications as prescribed Continue all other medications as before You can also take Delsym OTC for cough, and/or Mucinex (or it's generic off brand) for congestion Please return in 6 mo with Lab testing done 3-5 days before  

## 2010-12-03 NOTE — Progress Notes (Signed)
  Subjective:    Patient ID: [name], female    DOB: 31-Oct-1970, 40 y.o.   MRN: [id number]  HPI  Here with 3 days acute onset fever, facial pain, pressure, general weakness and malaise, and greenish d/c, with slight ST, but little to no cough and Pt denies chest pain, increased sob or doe, wheezing, orthopnea, PND, increased LE swelling, palpitations, dizziness or syncope.   Pt denies polydipsia, polyuria.  Pt denies new neurological symptoms such as new headache, or facial or extremity weakness or numbness   Pt denies fever, wt loss, night sweats, loss of appetite, or other constitutional symptoms, except for symptoms during above  Denies worsening depressive symptoms, suicidal ideation, or panic, though has ongoing anxiety, not increased recently. Past Medical History  Diagnosis Date  . Impaired glucose tolerance 12/03/2010  . HYPERLIPIDEMIA 04/09/2007  . GERD 01/17/2010  . ANXIETY 09/01/2006  . ALLERGIC RHINITIS 09/01/2006   No past surgical history on file.  reports that she has never smoked. She does not have any smokeless tobacco history on file. Her alcohol and drug histories not on file. family history is not on file. Allergies  Allergen Reactions  . Ciprofloxacin   . Sulfonamide Derivatives    No current outpatient prescriptions on file prior to visit.   Review of Systems Review of Systems  Constitutional: Negative for diaphoresis and unexpected weight change.  HENT: Negative for drooling and tinnitus.   Eyes: Negative for photophobia and visual disturbance.  Respiratory: Negative for choking and stridor.   Gastrointestinal: Negative for vomiting and blood in stool.  Genitourinary: Negative for hematuria and decreased urine volume.      Objective:   Physical Exam BP 110/72  Pulse 101  Temp(Src) 98.1 F (36.7 C) (Oral)  Ht 5\' 1"  (1.549 m)  Wt 172 lb 6 oz (78.189 kg)  BMI 32.57 kg/m2  SpO2 99%  LMP 11/15/2010 Physical Exam  VS noted, mild ill Constitutional: Pt  appears well-developed and well-nourished.  HENT: Head: Normocephalic.  Right Ear: External ear normal.  Left Ear: External ear normal.  Bilat tm's mild erythema.  Sinus tender bilat.  Pharynx mild erythema Eyes: Conjunctivae and EOM are normal. Pupils are equal, round, and reactive to light.  Neck: Normal range of motion. Neck supple.  Cardiovascular: Normal rate and regular rhythm.   Pulmonary/Chest: Effort normal and breath sounds normal.  Neurological: Pt is alert. No cranial nerve deficit.  Skin: Skin is warm. No erythema.  Psychiatric: Pt behavior is normal. Thought content normal. Not nerovus or depressed affect today    Assessment & Plan:

## 2010-12-03 NOTE — Assessment & Plan Note (Signed)
stable overall by hx and exam, most recent data reviewed with pt, and pt to continue medical treatment as before  Lab Results  Component Value Date   WBC 7.4 01/16/2010   HGB 12.5 01/16/2010   HCT 36.1 01/16/2010   PLT 258.0 01/16/2010   GLUCOSE 89 01/16/2010   CHOL 181 01/16/2010   TRIG 61.0 01/16/2010   HDL 43.90 01/16/2010   LDLDIRECT 142.5 09/01/2006   LDLCALC 125* 01/16/2010   ALT 12 01/16/2010   AST 14 01/16/2010   NA 138 01/16/2010   K 3.8 01/16/2010   CL 105 01/16/2010   CREATININE 0.5 01/16/2010   BUN 8 01/16/2010   CO2 26 01/16/2010   TSH 1.64 01/16/2010   HGBA1C 5.8 04/09/2007

## 2010-12-03 NOTE — Assessment & Plan Note (Signed)
Mild to mod, for antibx course,  to f/u any worsening symptoms or concerns 

## 2010-12-05 ENCOUNTER — Telehealth: Payer: Self-pay

## 2010-12-05 NOTE — Telephone Encounter (Signed)
Patient informed. 

## 2010-12-05 NOTE — Telephone Encounter (Signed)
Ok to take tylenol, and even alleve bid prn if needed

## 2010-12-05 NOTE — Telephone Encounter (Signed)
Patient left message she still has a fever. Mornings it starts out at 101 and increases as the day goes by evening 102 to 103. She does take tylenol and it comes down. Does she need to come back in or wait it out?

## 2011-02-28 ENCOUNTER — Encounter: Payer: Self-pay | Admitting: Internal Medicine

## 2011-02-28 ENCOUNTER — Ambulatory Visit (INDEPENDENT_AMBULATORY_CARE_PROVIDER_SITE_OTHER): Payer: BC Managed Care – PPO | Admitting: Internal Medicine

## 2011-02-28 VITALS — BP 110/70 | HR 83 | Temp 97.8°F | Ht 61.0 in | Wt 172.5 lb

## 2011-02-28 DIAGNOSIS — F411 Generalized anxiety disorder: Secondary | ICD-10-CM

## 2011-02-28 DIAGNOSIS — K219 Gastro-esophageal reflux disease without esophagitis: Secondary | ICD-10-CM

## 2011-02-28 DIAGNOSIS — R7302 Impaired glucose tolerance (oral): Secondary | ICD-10-CM

## 2011-02-28 DIAGNOSIS — R7309 Other abnormal glucose: Secondary | ICD-10-CM

## 2011-02-28 DIAGNOSIS — J069 Acute upper respiratory infection, unspecified: Secondary | ICD-10-CM | POA: Insufficient documentation

## 2011-02-28 MED ORDER — AZITHROMYCIN 250 MG PO TABS: ORAL_TABLET | ORAL | Status: AC

## 2011-02-28 MED ORDER — HYDROCODONE-HOMATROPINE 5-1.5 MG/5ML PO SYRP: 5.0000 mL | ORAL_SOLUTION | Freq: Four times a day (QID) | ORAL | Status: AC | PRN

## 2011-02-28 NOTE — Progress Notes (Signed)
  Subjective:    Patient ID: [name], female    DOB: 1970/12/23, 41 y.o.   MRN: [id number]  HPI    Here with 3 days acute onset fever, facial pain, pressure, general weakness and malaise, and greenish d/c, with mod ST, and significant nonprod cough and Pt denies chest pain, increased sob or doe, wheezing, orthopnea, PND, increased LE swelling, palpitations, dizziness or syncope. Pt denies new neurological symptoms such as new headache, or facial or extremity weakness or numbness   Pt denies polydipsia, polyuria. Not pregnant.  Denies worsening reflux, dysphagia, abd pain, n/v, bowel change or blood.  Denies worsening depressive symptoms, suicidal ideation, or panic, though has ongoing anxiety, not increased recently.  Past Medical History  Diagnosis Date  . Impaired glucose tolerance 12/03/2010  . HYPERLIPIDEMIA 04/09/2007  . GERD 01/17/2010  . ANXIETY 09/01/2006  . ALLERGIC RHINITIS 09/01/2006   No past surgical history on file.  reports that she has never smoked. She does not have any smokeless tobacco history on file. Her alcohol and drug histories not on file. family history is not on file. Allergies  Allergen Reactions  . Ciprofloxacin   . Sulfonamide Derivatives    Current Outpatient Prescriptions on File Prior to Visit  Medication Sig Dispense Refill  . metoprolol tartrate (LOPRESSOR) 25 MG tablet 1/2 tablet two times daily        Review of Systems Review of Systems  Constitutional: Negative for diaphoresis and unexpected weight change.  HENT: Negative for drooling and tinnitus.   Eyes: Negative for photophobia and visual disturbance.  Respiratory: Negative for choking and stridor.   Gastrointestinal: Negative for vomiting and blood in stool.  Genitourinary: Negative for hematuria and decreased urine volume.      Objective:   Physical Exam BP 110/70  Pulse 83  Temp(Src) 97.8 F (36.6 C) (Oral)  Ht 5\' 1"  (1.549 m)  Wt 172 lb 8 oz (78.245 kg)  BMI 32.59 kg/m2  SpO2  96% Physical Exam  VS noted, mild ill Constitutional: Pt appears well-developed and well-nourished.  HENT: Head: Normocephalic.  Right Ear: External ear normal.  Left Ear: External ear normal.  Bilat tm's mild erythema.  Sinus nontender.  Pharynx mild erythema Eyes: Conjunctivae and EOM are normal. Pupils are equal, round, and reactive to light.  Neck: Normal range of motion. Neck supple.  Cardiovascular: Normal rate and regular rhythm.   Pulmonary/Chest: Effort normal and breath sounds normal.  Abd:  Soft, NT, non-distended, + BS Neurological: Pt is alert. No cranial nerve deficit.  Skin: Skin is warm. No erythema.  Psychiatric: Pt behavior is normal. Thought content normal. 1+ nervous    Assessment & Plan:

## 2011-02-28 NOTE — Assessment & Plan Note (Signed)
stable overall by hx and exam, most recent data reviewed with pt, and pt to continue medical treatment as before  Lab Results  Component Value Date   WBC 7.4 01/16/2010   HGB 12.5 01/16/2010   HCT 36.1 01/16/2010   PLT 258.0 01/16/2010   GLUCOSE 89 01/16/2010   CHOL 181 01/16/2010   TRIG 61.0 01/16/2010   HDL 43.90 01/16/2010   LDLDIRECT 142.5 09/01/2006   LDLCALC 125* 01/16/2010   ALT 12 01/16/2010   AST 14 01/16/2010   NA 138 01/16/2010   K 3.8 01/16/2010   CL 105 01/16/2010   CREATININE 0.5 01/16/2010   BUN 8 01/16/2010   CO2 26 01/16/2010   TSH 1.64 01/16/2010   HGBA1C 5.8 04/09/2007    

## 2011-02-28 NOTE — Assessment & Plan Note (Signed)
stable overall by hx and exam, , and pt to continue medical treatment as before   

## 2011-02-28 NOTE — Assessment & Plan Note (Signed)
stable overall by hx and exam, most recent data reviewed with pt, and pt to continue medical treatment as before Lab Results  Component Value Date   HGBA1C 5.8 04/09/2007    

## 2011-02-28 NOTE — Patient Instructions (Signed)
Take all new medications as prescribed Continue all other medications as before  

## 2011-02-28 NOTE — Assessment & Plan Note (Signed)
Mild to mod, for antibx course,  to f/u any worsening symptoms or concerns 

## 2011-03-05 ENCOUNTER — Other Ambulatory Visit: Payer: Self-pay | Admitting: Internal Medicine

## 2011-03-08 ENCOUNTER — Telehealth: Payer: Self-pay

## 2011-03-08 MED ORDER — OSELTAMIVIR PHOSPHATE 75 MG PO CAPS: 75.0000 mg | ORAL_CAPSULE | Freq: Two times a day (BID) | ORAL | Status: AC

## 2011-03-08 NOTE — Telephone Encounter (Signed)
Pt called stating she has been exposed to the flu via her son and was advised by Pediatrician that she should consider taking Tamiflu, please advise.

## 2011-03-08 NOTE — Telephone Encounter (Signed)
Patient informed. 

## 2011-03-08 NOTE — Telephone Encounter (Signed)
Good idea, for tamiflu - done per emr

## 2011-10-16 ENCOUNTER — Ambulatory Visit (INDEPENDENT_AMBULATORY_CARE_PROVIDER_SITE_OTHER): Payer: BC Managed Care – PPO | Admitting: Internal Medicine

## 2011-10-16 ENCOUNTER — Encounter: Payer: Self-pay | Admitting: Internal Medicine

## 2011-10-16 VITALS — BP 110/70 | HR 89 | Temp 97.8°F | Ht 61.0 in | Wt 174.5 lb

## 2011-10-16 DIAGNOSIS — J019 Acute sinusitis, unspecified: Secondary | ICD-10-CM

## 2011-10-16 DIAGNOSIS — R7309 Other abnormal glucose: Secondary | ICD-10-CM

## 2011-10-16 DIAGNOSIS — Z23 Encounter for immunization: Secondary | ICD-10-CM

## 2011-10-16 DIAGNOSIS — R7302 Impaired glucose tolerance (oral): Secondary | ICD-10-CM

## 2011-10-16 DIAGNOSIS — Z0000 Encounter for general adult medical examination without abnormal findings: Secondary | ICD-10-CM

## 2011-10-16 DIAGNOSIS — J309 Allergic rhinitis, unspecified: Secondary | ICD-10-CM

## 2011-10-16 DIAGNOSIS — F411 Generalized anxiety disorder: Secondary | ICD-10-CM

## 2011-10-16 MED ORDER — AZITHROMYCIN 250 MG PO TABS: ORAL_TABLET | ORAL | Status: DC

## 2011-10-16 NOTE — Assessment & Plan Note (Signed)
For OTC allegra or zyrtec prn,  to f/u any worsening symptoms or concerns

## 2011-10-16 NOTE — Patient Instructions (Addendum)
You had the flu shot today Take all new medications as prescribed Continue all other medications as before You can also take Mucinex (or it's generic off brand) for congestion You can also try Allegra or Zyrtec OTC for allergies as well Please return in 3 mo with Lab testing done 3-5 days before

## 2011-10-16 NOTE — Assessment & Plan Note (Signed)
Mild to mod, for antibx course,  to f/u any worsening symptoms or concerns 

## 2011-10-16 NOTE — Assessment & Plan Note (Signed)
stable overall by hx and exam, most recent data reviewed with pt, and pt to continue medical treatment as before Lab Results  Component Value Date   HGBA1C 5.8 04/09/2007

## 2011-10-16 NOTE — Assessment & Plan Note (Signed)
stable overall by hx and exam, most recent data reviewed with pt, and pt to continue medical treatment as before  Lab Results  Component Value Date   WBC 7.4 01/16/2010   HGB 12.5 01/16/2010   HCT 36.1 01/16/2010   PLT 258.0 01/16/2010   GLUCOSE 89 01/16/2010   CHOL 181 01/16/2010   TRIG 61.0 01/16/2010   HDL 43.90 01/16/2010   LDLDIRECT 142.5 09/01/2006   LDLCALC 125* 01/16/2010   ALT 12 01/16/2010   AST 14 01/16/2010   NA 138 01/16/2010   K 3.8 01/16/2010   CL 105 01/16/2010   CREATININE 0.5 01/16/2010   BUN 8 01/16/2010   CO2 26 01/16/2010   TSH 1.64 01/16/2010   HGBA1C 5.8 04/09/2007    

## 2011-10-16 NOTE — Progress Notes (Signed)
  Subjective:    Patient ID: [name], female    DOB: 1970/04/04, 41 y.o.   MRN: [id number]  HPI   Here with 3 days acute onset fever, facial pain, pressure, general weakness and malaise, and greenish d/c, with slight ST, but little to no cough and Pt denies chest pain, increased sob or doe, wheezing, orthopnea, PND, increased LE swelling, palpitations, dizziness or syncope.  Pt denies new neurological symptoms such as new headache, or facial or extremity weakness or numbness  Pt denies polydipsia, polyuria, or low sugar symptoms such as weakness or confusion improved with po intake.  Pt states overall good compliance with meds, trying to follow lower cholesterol diet,  Denies worsening depressive symptoms, suicidal ideation, or panic. Does have several wks ongoing nasal allergy symptoms with clear congestion, itch and sneeze, without fever, pain, ST, cough or wheezing.  Past Medical History  Diagnosis Date  . Impaired glucose tolerance 12/03/2010  . HYPERLIPIDEMIA 04/09/2007  . GERD 01/17/2010  . ANXIETY 09/01/2006  . ALLERGIC RHINITIS 09/01/2006   No past surgical history on file.  reports that she has never smoked. She does not have any smokeless tobacco history on file. Her alcohol and drug histories not on file. family history is not on file. Allergies  Allergen Reactions  . Ciprofloxacin   . Sulfonamide Derivatives    Review of Systems  Constitutional: Negative for diaphoresis and unexpected weight change.  HENT: Negative for tinnitus.   Eyes: Negative for photophobia and visual disturbance.  Respiratory: Negative for choking and stridor.   Gastrointestinal: Negative for vomiting and blood in stool.  Genitourinary: Negative for hematuria and decreased urine volume.  Musculoskeletal: Negative for gait problem.  Skin: Negative for color change and wound.  Neurological: Negative for tremors and numbness.  Psychiatric/Behavioral: Negative for decreased concentration. The patient is  not hyperactive.       Objective:   Physical Exam BP 110/70  Pulse 89  Temp 97.8 F (36.6 C) (Oral)  Ht 5\' 1"  (1.549 m)  Wt 174 lb 8 oz (79.153 kg)  BMI 32.97 kg/m2  SpO2 98% Physical Exam  VS noted, mild ill Constitutional: Pt appears well-developed and well-nourished.  HENT: Head: Normocephalic.  Right Ear: External ear normal.  Left Ear: External ear normal.  Bilat tm's mild erythema.  Sinus tender bilat.  Pharynx mild erythema Eyes: Conjunctivae and EOM are normal. Pupils are equal, round, and reactive to light.  Neck: Normal range of motion. Neck supple.  Cardiovascular: Normal rate and regular rhythm.   Pulmonary/Chest: Effort normal and breath sounds normal.  Neurological: Pt is alert. Not confused  Skin: Skin is warm. No erythema.  Psychiatric: Pt behavior is normal. Thought content normal. not nervous or depressed affect    Assessment & Plan:

## 2011-11-04 ENCOUNTER — Other Ambulatory Visit: Payer: Self-pay | Admitting: Obstetrics & Gynecology

## 2011-11-04 DIAGNOSIS — Z1231 Encounter for screening mammogram for malignant neoplasm of breast: Secondary | ICD-10-CM

## 2011-12-02 ENCOUNTER — Ambulatory Visit
Admission: RE | Admit: 2011-12-02 | Discharge: 2011-12-02 | Disposition: A | Discharge status: Home / Self Care | Payer: BC Managed Care – PPO | Source: Ambulatory Visit | Attending: Obstetrics & Gynecology | Admitting: Obstetrics & Gynecology

## 2011-12-02 DIAGNOSIS — Z1231 Encounter for screening mammogram for malignant neoplasm of breast: Secondary | ICD-10-CM

## 2012-01-13 ENCOUNTER — Encounter: Payer: BC Managed Care – PPO | Admitting: Internal Medicine

## 2012-03-03 ENCOUNTER — Other Ambulatory Visit: Payer: Self-pay | Admitting: Internal Medicine

## 2012-09-29 ENCOUNTER — Ambulatory Visit (INDEPENDENT_AMBULATORY_CARE_PROVIDER_SITE_OTHER): Payer: BC Managed Care – PPO | Admitting: Internal Medicine

## 2012-09-29 ENCOUNTER — Encounter: Payer: Self-pay | Admitting: *Deleted

## 2012-09-29 ENCOUNTER — Encounter: Payer: Self-pay | Admitting: Internal Medicine

## 2012-09-29 VITALS — BP 120/82 | HR 88 | Temp 97.4°F | Wt 178.4 lb

## 2012-09-29 DIAGNOSIS — Z0000 Encounter for general adult medical examination without abnormal findings: Secondary | ICD-10-CM

## 2012-09-29 DIAGNOSIS — Z23 Encounter for immunization: Secondary | ICD-10-CM

## 2012-09-29 DIAGNOSIS — J019 Acute sinusitis, unspecified: Secondary | ICD-10-CM

## 2012-09-29 MED ORDER — PROMETHAZINE-PHENYLEPHRINE 6.25-5 MG/5ML PO SYRP: 5.0000 mL | ORAL_SOLUTION | ORAL | Status: DC | PRN

## 2012-09-29 MED ORDER — AZITHROMYCIN 250 MG PO TABS: ORAL_TABLET | ORAL | Status: DC

## 2012-09-29 NOTE — Patient Instructions (Addendum)
It was good to see you today. Your annual flu shot was given and/or updated today. Health Maintenance reviewed - all recommended immunizations and age-appropriate screenings are up-to-date. Medications reviewed and updated Zpak antibiotics and prescription cough/decongestion syrup - Your prescription(s) have been submitted to your pharmacy. Please take as directed and contact our office if you believe you are having problem(s) with the medication(s). Alternate between ibuprofen and tylenol for aches, pain and fever symptoms as discussed Hydrate, rest and call if worse or unimproved Work excuse note for today and tomorrow as discussed   Health Maintenance, Females A healthy lifestyle and preventative care can promote health and wellness.  Maintain regular health, dental, and eye exams.  Eat a healthy diet. Foods like vegetables, fruits, whole grains, low-fat dairy products, and lean protein foods contain the nutrients you need without too many calories. Decrease your intake of foods high in solid fats, added sugars, and salt. Get information about a proper diet from your caregiver, if necessary.  Regular physical exercise is one of the most important things you can do for your health. Most adults should get at least 150 minutes of moderate-intensity exercise (any activity that increases your heart rate and causes you to sweat) each week. In addition, most adults need muscle-strengthening exercises on 2 or more days a week.   Maintain a healthy weight. The body mass index (BMI) is a screening tool to identify possible weight problems. It provides an estimate of body fat based on height and weight. Your caregiver can help determine your BMI, and can help you achieve or maintain a healthy weight. For adults 20 years and older:  A BMI below 18.5 is considered underweight.  A BMI of 18.5 to 24.9 is normal.  A BMI of 25 to 29.9 is considered overweight.  A BMI of 30 and above is considered  obese.  Maintain normal blood lipids and cholesterol by exercising and minimizing your intake of saturated fat. Eat a balanced diet with plenty of fruits and vegetables. Blood tests for lipids and cholesterol should begin at age 96 and be repeated every 5 years. If your lipid or cholesterol levels are high, you are over 50, or you are a high risk for heart disease, you may need your cholesterol levels checked more frequently.Ongoing high lipid and cholesterol levels should be treated with medicines if diet and exercise are not effective.  If you smoke, find out from your caregiver how to quit. If you do not use tobacco, do not start.  If you are pregnant, do not drink alcohol. If you are breastfeeding, be very cautious about drinking alcohol. If you are not pregnant and choose to drink alcohol, do not exceed 1 drink per day. One drink is considered to be 12 ounces (355 mL) of beer, 5 ounces (148 mL) of wine, or 1.5 ounces (44 mL) of liquor.  Avoid use of street drugs. Do not share needles with anyone. Ask for help if you need support or instructions about stopping the use of drugs.  High blood pressure causes heart disease and increases the risk of stroke. Blood pressure should be checked at least every 1 to 2 years. Ongoing high blood pressure should be treated with medicines, if weight loss and exercise are not effective.  If you are 39 to 42 years old, ask your caregiver if you should take aspirin to prevent strokes.  Diabetes screening involves taking a blood sample to check your fasting blood sugar level. This should be done once  every 3 years, after age 20, if you are within normal weight and without risk factors for diabetes. Testing should be considered at a younger age or be carried out more frequently if you are overweight and have at least 1 risk factor for diabetes.  Breast cancer screening is essential preventative care for women. You should practice "breast self-awareness." This means  understanding the normal appearance and feel of your breasts and may include breast self-examination. Any changes detected, no matter how small, should be reported to a caregiver. Women in their 34s and 30s should have a clinical breast exam (CBE) by a caregiver as part of a regular health exam every 1 to 3 years. After age 77, women should have a CBE every year. Starting at age 15, women should consider having a mammogram (breast X-ray) every year. Women who have a family history of breast cancer should talk to their caregiver about genetic screening. Women at a high risk of breast cancer should talk to their caregiver about having an MRI and a mammogram every year.  The Pap test is a screening test for cervical cancer. Women should have a Pap test starting at age 34. Between ages 76 and 6, Pap tests should be repeated every 2 years. Beginning at age 16, you should have a Pap test every 3 years as long as the past 3 Pap tests have been normal. If you had a hysterectomy for a problem that was not cancer or a condition that could lead to cancer, then you no longer need Pap tests. If you are between ages 92 and 26, and you have had normal Pap tests going back 10 years, you no longer need Pap tests. If you have had past treatment for cervical cancer or a condition that could lead to cancer, you need Pap tests and screening for cancer for at least 20 years after your treatment. If Pap tests have been discontinued, risk factors (such as a new sexual partner) need to be reassessed to determine if screening should be resumed. Some women have medical problems that increase the chance of getting cervical cancer. In these cases, your caregiver may recommend more frequent screening and Pap tests.  The human papillomavirus (HPV) test is an additional test that may be used for cervical cancer screening. The HPV test looks for the virus that can cause the cell changes on the cervix. The cells collected during the Pap test  can be tested for HPV. The HPV test could be used to screen women aged 67 years and older, and should be used in women of any age who have unclear Pap test results. After the age of 82, women should have HPV testing at the same frequency as a Pap test.  Colorectal cancer can be detected and often prevented. Most routine colorectal cancer screening begins at the age of 66 and continues through age 22. However, your caregiver may recommend screening at an earlier age if you have risk factors for colon cancer. On a yearly basis, your caregiver may provide home test kits to check for hidden blood in the stool. Use of a small camera at the end of a tube, to directly examine the colon (sigmoidoscopy or colonoscopy), can detect the earliest forms of colorectal cancer. Talk to your caregiver about this at age 46, when routine screening begins. Direct examination of the colon should be repeated every 5 to 10 years through age 25, unless early forms of pre-cancerous polyps or small growths are found.  Hepatitis  C blood testing is recommended for all people born from [birth year] through 1965 and any individual with known risks for hepatitis C.  Practice safe sex. Use condoms and avoid high-risk sexual practices to reduce the spread of sexually transmitted infections (STIs). Sexually active women aged 51 and younger should be checked for Chlamydia, which is a common sexually transmitted infection. Older women with new or multiple partners should also be tested for Chlamydia. Testing for other STIs is recommended if you are sexually active and at increased risk.  Osteoporosis is a disease in which the bones lose minerals and strength with aging. This can result in serious bone fractures. The risk of osteoporosis can be identified using a bone density scan. Women ages 26 and over and women at risk for fractures or osteoporosis should discuss screening with their caregivers. Ask your caregiver whether you should be taking a  calcium supplement or vitamin D to reduce the rate of osteoporosis.  Menopause can be associated with physical symptoms and risks. Hormone replacement therapy is available to decrease symptoms and risks. You should talk to your caregiver about whether hormone replacement therapy is right for you.  Use sunscreen with a sun protection factor (SPF) of 30 or greater. Apply sunscreen liberally and repeatedly throughout the day. You should seek shade when your shadow is shorter than you. Protect yourself by wearing long sleeves, pants, a wide-brimmed hat, and sunglasses year round, whenever you are outdoors.  Notify your caregiver of new moles or changes in moles, especially if there is a change in shape or color. Also notify your caregiver if a mole is larger than the size of a pencil eraser.  Stay current with your immunizations. Document Released: 07/23/2010 Document Revised: 04/01/2011 Document Reviewed: 07/23/2010 [facility] Patient Information 2014 [place], [place].

## 2012-09-29 NOTE — Progress Notes (Signed)
Subjective:    Patient ID: [name], female    DOB: 02/07/1970, 42 y.o.   MRN: [id number]  HPI patient is here today for annual physical. Patient feels well in general  Also complains of sinus pressure x 4 days - ?infection  Past Medical History  Diagnosis Date  . Impaired glucose tolerance   . HYPERLIPIDEMIA   . GERD   . ANXIETY   . ALLERGIC RHINITIS    Family History  Problem Relation Age of Onset  . Breast cancer Mother 82    History  Substance Use Topics  . Smoking status: Never Smoker   . Smokeless tobacco: Not on file  . Alcohol Use: Not on file    Review of Systems Constitutional: Negative for fever or weight change.  Respiratory: Negative for shortness of breath.  Dry cough and sinus congestion x 3 days Cardiovascular: Negative for chest pain or palpitations.  Gastrointestinal: Negative for abdominal pain, no bowel changes.  Musculoskeletal: Negative for gait problem or joint swelling.  Skin: Negative for rash.  Neurological: Negative for dizziness or headache.  No other specific complaints in a complete review of systems (except as listed in HPI above).     Objective:   Physical Exam BP 120/82  Pulse 88  Temp(Src) 97.4 F (36.3 C) (Oral)  Wt 178 lb 6.4 oz (80.922 kg)  BMI 33.73 kg/m2  SpO2 94% Wt Readings from Last 3 Encounters:  09/29/12 178 lb 6.4 oz (80.922 kg)  10/16/11 174 lb 8 oz (79.153 kg)  02/28/11 172 lb 8 oz (78.245 kg)   Constitutional: She appears well-developed and well-nourished. No distress.  HENT: Head: Normocephalic and atraumatic. B maxillary sinus pressure Ears: B TMs ok, no erythema or effusion; Nose: sinus pallor. Mouth/Throat: Oropharynx is clear and moist. No oropharyngeal exudate.  Eyes: Conjunctivae and EOM are normal. Pupils are equal, round, and reactive to light. No scleral icterus.  Neck: Normal range of motion. Neck supple. No JVD present. No thyromegaly present.  Cardiovascular: Normal rate, regular rhythm and  normal heart sounds.  No murmur heard. No BLE edema. Pulmonary/Chest: Effort normal and breath sounds normal. No respiratory distress. She has no wheezes.  Abdominal: Soft. Bowel sounds are normal. She exhibits no distension. There is no tenderness. no masses Musculoskeletal: Normal range of motion, no joint effusions. No gross deformities Neurological: She is alert and oriented to person, place, and time. No cranial nerve deficit. Coordination, balance, strength, speech and gait are normal.  Skin: Skin is warm and dry. No rash noted. No erythema.  Psychiatric: She has a normal mood and affect. Her behavior is normal. Judgment and thought content normal.   Lab Results  Component Value Date   WBC 7.4 01/16/2010   HGB 12.5 01/16/2010   HCT 36.1 01/16/2010   PLT 258.0 01/16/2010   GLUCOSE 89 01/16/2010   CHOL 181 01/16/2010   TRIG 61.0 01/16/2010   HDL 43.90 01/16/2010   LDLDIRECT 142.5 09/01/2006   LDLCALC 125* 01/16/2010   ALT 12 01/16/2010   AST 14 01/16/2010   NA 138 01/16/2010   K 3.8 01/16/2010   CL 105 01/16/2010   CREATININE 0.5 01/16/2010   BUN 8 01/16/2010   CO2 26 01/16/2010   TSH 1.64 01/16/2010   HGBA1C 5.8 04/09/2007       Assessment & Plan:   CPX/v70.0 - Patient has been counseled on age-appropriate routine health concerns for screening and prevention. These are reviewed and up-to-date. Immunizations are up-to-date or declined. Labs  ordered and reviewed.  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

## 2012-10-26 ENCOUNTER — Other Ambulatory Visit: Payer: Self-pay

## 2012-10-26 DIAGNOSIS — Z1231 Encounter for screening mammogram for malignant neoplasm of breast: Secondary | ICD-10-CM

## 2012-12-07 ENCOUNTER — Ambulatory Visit
Admission: RE | Admit: 2012-12-07 | Discharge: 2012-12-07 | Disposition: A | Discharge status: Home / Self Care | Payer: BC Managed Care – PPO | Source: Ambulatory Visit

## 2012-12-07 DIAGNOSIS — Z1231 Encounter for screening mammogram for malignant neoplasm of breast: Secondary | ICD-10-CM

## 2012-12-08 LAB — HM MAMMOGRAPHY

## 2012-12-26 ENCOUNTER — Other Ambulatory Visit: Payer: Self-pay | Admitting: Internal Medicine

## 2012-12-30 ENCOUNTER — Telehealth: Payer: Self-pay

## 2012-12-30 MED ORDER — NAPROXEN 500 MG PO TABS: 500.0000 mg | ORAL_TABLET | Freq: Two times a day (BID) | ORAL | Status: DC

## 2012-12-30 NOTE — Telephone Encounter (Signed)
Done erx 

## 2012-12-30 NOTE — Telephone Encounter (Signed)
Refill request for Naproxen but not on current list, advise please

## 2013-01-02 ENCOUNTER — Other Ambulatory Visit: Payer: Self-pay | Admitting: Internal Medicine

## 2013-01-04 ENCOUNTER — Telehealth: Payer: Self-pay | Admitting: Internal Medicine

## 2013-01-04 NOTE — Telephone Encounter (Signed)
Pt is requesting a refill on metoprolol to last until her physical scheduled on March 6.   Pharmacy is [facility] in [place] on [address]. Please put labs in the system for physical.

## 2013-01-05 MED ORDER — METOPROLOL TARTRATE 25 MG PO TABS: 25.0000 mg | ORAL_TABLET | Freq: Every day | ORAL | Status: DC

## 2013-03-22 ENCOUNTER — Other Ambulatory Visit (INDEPENDENT_AMBULATORY_CARE_PROVIDER_SITE_OTHER): Payer: BC Managed Care – PPO

## 2013-03-22 DIAGNOSIS — Z0000 Encounter for general adult medical examination without abnormal findings: Secondary | ICD-10-CM

## 2013-03-22 LAB — HEPATIC FUNCTION PANEL
ALK PHOS: 49 U/L (ref 39–117)
ALT: 14 U/L (ref 0–35)
AST: 17 U/L (ref 0–37)
Albumin: 3.7 g/dL (ref 3.5–5.2)
BILIRUBIN DIRECT: 0 mg/dL (ref 0.0–0.3)
Total Bilirubin: 0.6 mg/dL (ref 0.3–1.2)
Total Protein: 7.2 g/dL (ref 6.0–8.3)

## 2013-03-22 LAB — URINALYSIS, ROUTINE W REFLEX MICROSCOPIC
Bilirubin Urine: NEGATIVE
Ketones, ur: NEGATIVE
LEUKOCYTES UA: NEGATIVE
NITRITE: NEGATIVE
Specific Gravity, Urine: 1.015 (ref 1.000–1.030)
TOTAL PROTEIN, URINE-UPE24: NEGATIVE
Urine Glucose: NEGATIVE
Urobilinogen, UA: 0.2 (ref 0.0–1.0)
pH: 7.5 (ref 5.0–8.0)

## 2013-03-22 LAB — BASIC METABOLIC PANEL
BUN: 7 mg/dL (ref 6–23)
CHLORIDE: 105 meq/L (ref 96–112)
CO2: 28 mEq/L (ref 19–32)
CREATININE: 0.5 mg/dL (ref 0.4–1.2)
Calcium: 9 mg/dL (ref 8.4–10.5)
GFR: 143.37 mL/min (ref >60.00)
Glucose, Bld: 98 mg/dL (ref 70–99)
Potassium: 3.7 mEq/L (ref 3.5–5.1)
Sodium: 139 mEq/L (ref 135–145)

## 2013-03-22 LAB — CBC WITH DIFFERENTIAL/PLATELET
BASOS ABS: 0 10*3/uL (ref 0.0–0.1)
Basophils Relative: 0.4 % (ref 0.0–3.0)
Eosinophils Absolute: 0.5 10*3/uL (ref 0.0–0.7)
Eosinophils Relative: 5.9 % — ABNORMAL HIGH (ref 0.0–5.0)
HCT: 39.3 % (ref 36.0–46.0)
Hemoglobin: 13 g/dL (ref 12.0–15.0)
Lymphocytes Relative: 22.7 % (ref 12.0–46.0)
Lymphs Abs: 1.8 10*3/uL (ref 0.7–4.0)
MCHC: 33.1 g/dL (ref 30.0–36.0)
MCV: 89.4 fl (ref 78.0–100.0)
MONO ABS: 0.4 10*3/uL (ref 0.1–1.0)
Monocytes Relative: 5.2 % (ref 3.0–12.0)
NEUTROS PCT: 65.8 % (ref 43.0–77.0)
Neutro Abs: 5.3 10*3/uL (ref 1.4–7.7)
PLATELETS: 303 10*3/uL (ref 150.0–400.0)
RBC: 4.39 Mil/uL (ref 3.87–5.11)
RDW: 12.7 % (ref 11.5–14.6)
WBC: 8 10*3/uL (ref 4.5–10.5)

## 2013-03-22 LAB — LIPID PANEL
CHOLESTEROL: 182 mg/dL (ref 0–200)
HDL: 48 mg/dL (ref >39.00)
LDL Cholesterol: 118 mg/dL — ABNORMAL HIGH (ref 0–99)
Total CHOL/HDL Ratio: 4
Triglycerides: 81 mg/dL (ref 0.0–149.0)
VLDL: 16.2 mg/dL (ref 0.0–40.0)

## 2013-03-22 LAB — TSH: TSH: 2.03 u[IU]/mL (ref 0.35–5.50)

## 2013-03-26 ENCOUNTER — Ambulatory Visit (INDEPENDENT_AMBULATORY_CARE_PROVIDER_SITE_OTHER): Payer: BC Managed Care – PPO | Admitting: Internal Medicine

## 2013-03-26 ENCOUNTER — Encounter: Payer: Self-pay | Admitting: Internal Medicine

## 2013-03-26 VITALS — BP 102/70 | HR 80 | Temp 97.8°F | Ht 61.0 in | Wt 174.0 lb

## 2013-03-26 DIAGNOSIS — K921 Melena: Secondary | ICD-10-CM

## 2013-03-26 DIAGNOSIS — Z0000 Encounter for general adult medical examination without abnormal findings: Secondary | ICD-10-CM

## 2013-03-26 DIAGNOSIS — Z0001 Encounter for general adult medical examination with abnormal findings: Secondary | ICD-10-CM | POA: Insufficient documentation

## 2013-03-26 DIAGNOSIS — K649 Unspecified hemorrhoids: Secondary | ICD-10-CM | POA: Insufficient documentation

## 2013-03-26 MED ORDER — HYDROCORTISONE ACETATE 25 MG RE SUPP: 25.0000 mg | Freq: Two times a day (BID) | RECTAL | Status: DC

## 2013-03-26 MED ORDER — METOPROLOL TARTRATE 25 MG PO TABS: 25.0000 mg | ORAL_TABLET | Freq: Every day | ORAL | Status: DC

## 2013-03-26 NOTE — Progress Notes (Signed)
Subjective:    Patient ID: [name], female    DOB: [birth year], 43 y.o.   MRN: [id number]  HPI  Here for wellness and f/u;  Overall doing ok;  Pt denies CP, worsening SOB, DOE, wheezing, orthopnea, PND, worsening LE edema, palpitations, dizziness or syncope.  Pt denies neurological change such as new headache, facial or extremity weakness.  Pt denies polydipsia, polyuria, or low sugar symptoms. Pt states overall good compliance with treatment and medications, good tolerability, and has been trying to follow lower cholesterol diet.  Pt denies worsening depressive symptoms, suicidal ideation or panic. No fever, night sweats, wt loss, loss of appetite, or other constitutional symptoms.  Pt states good ability with ADL's, has low fall risk, home safety reviewed and adequate, no other significant changes in hearing or vision, and only occasionally active with exercise.  Also with hemorrhoidal discomfort, recent small BRBPR. Past Medical History  Diagnosis Date  . Impaired glucose tolerance   . HYPERLIPIDEMIA   . GERD   . ANXIETY   . ALLERGIC RHINITIS    Past Surgical History  Procedure Laterality Date  . Tonsillectomy      reports that she has never smoked. She does not have any smokeless tobacco history on file. Her alcohol and drug histories are not on file. family history includes Breast cancer (age of onset: 20) in her mother. Allergies  Allergen Reactions  . Ciprofloxacin   . Sulfonamide Derivatives    No current outpatient prescriptions on file prior to visit.   No current facility-administered medications on file prior to visit.   Review of Systems Constitutional: Negative for diaphoresis, activity change, appetite change or unexpected weight change.  HENT: Negative for hearing loss, ear pain, facial swelling, mouth sores and neck stiffness.   Eyes: Negative for pain, redness and visual disturbance.  Respiratory: Negative for shortness of breath and wheezing.     Cardiovascular: Negative for chest pain and palpitations.  Gastrointestinal: Negative for diarrhea, blood in stool, abdominal distention or other pain Genitourinary: Negative for hematuria, flank pain or change in urine volume.  Musculoskeletal: Negative for myalgias and joint swelling.  Skin: Negative for color change and wound.  Neurological: Negative for syncope and numbness. other than noted Hematological: Negative for adenopathy.  Psychiatric/Behavioral: Negative for hallucinations, self-injury, decreased concentration and agitation.      Objective:   Physical Exam BP 102/70  Pulse 80  Temp(Src) 97.8 F (36.6 C) (Oral)  Ht 5\' 1"  (1.549 m)  Wt 174 lb (78.926 kg)  BMI 32.89 kg/m2  SpO2 94% VS noted,  Constitutional: Pt is oriented to person, place, and time. Appears well-developed and well-nourished.  Head: Normocephalic and atraumatic.  Right Ear: External ear normal.  Left Ear: External ear normal.  Nose: Nose normal.  Mouth/Throat: Oropharynx is clear and moist.  Eyes: Conjunctivae and EOM are normal. Pupils are equal, round, and reactive to light.  Neck: Normal range of motion. Neck supple. No JVD present. No tracheal deviation present.  Cardiovascular: Normal rate, regular rhythm, normal heart sounds and intact distal pulses.   Pulmonary/Chest: Effort normal and breath sounds normal.  Abdominal: Soft. Bowel sounds are normal. There is no tenderness. No HSM  Musculoskeletal: Normal range of motion. Exhibits no edema.  Lymphadenopathy:  Has no cervical adenopathy.  Neurological: Pt is alert and oriented to person, place, and time. Pt has normal reflexes. No cranial nerve deficit.  Skin: Skin is warm and dry. No rash noted.  Psychiatric:  Has  normal  mood and affect. Behavior is normal.      Assessment & Plan:

## 2013-03-26 NOTE — Progress Notes (Signed)
Pre visit review using our clinic review tool, if applicable. No additional management support is needed unless otherwise documented below in the visit note. 

## 2013-03-26 NOTE — Assessment & Plan Note (Signed)

## 2013-03-26 NOTE — Assessment & Plan Note (Signed)
For GI referral, ansusol HC prn

## 2013-03-26 NOTE — Patient Instructions (Addendum)
Please take all new medication as prescribed Please continue all other medications as before, and refills have been done if requested. Please have the pharmacy call with any other refills you may need.  Please continue your efforts at being more active, low cholesterol diet, and weight control. You are otherwise up to date with prevention measures today.  Please remember to sign up for MyChart if you have not done so, as this will be important to you in the future with finding out test results, communicating by private email, and scheduling acute appointments online when needed.  You will be contacted regarding the referral for: gastroenterology  Please return in 1 year for your yearly visit, or sooner if needed, with Lab testing done 3-5 days before

## 2013-04-01 ENCOUNTER — Encounter: Payer: Self-pay | Admitting: Gastroenterology

## 2013-05-18 ENCOUNTER — Encounter: Payer: Self-pay | Admitting: Gastroenterology

## 2013-05-18 ENCOUNTER — Ambulatory Visit (INDEPENDENT_AMBULATORY_CARE_PROVIDER_SITE_OTHER): Payer: BC Managed Care – PPO | Admitting: Gastroenterology

## 2013-05-18 VITALS — BP 100/76 | HR 96 | Ht 59.5 in | Wt 174.4 lb

## 2013-05-18 DIAGNOSIS — K625 Hemorrhage of anus and rectum: Secondary | ICD-10-CM

## 2013-05-18 MED ORDER — HYDROCORTISONE ACE-PRAMOXINE 2.5-1 % RE CREA: 1.0000 "application " | TOPICAL_CREAM | Freq: Every day | RECTAL | Status: DC

## 2013-05-18 NOTE — Patient Instructions (Addendum)
Prescription ointment given. Apply this once daily as needed. You will be set up for a colonoscopy for minor rectal bleeding. Please call [phone] when you are ready to schedule.

## 2013-05-18 NOTE — Progress Notes (Signed)
HPI: This is a    very pleasant 43 year old woman whom I am meeting for the first time today.  Anal discomfort for about 6 months.  Was told she had hemorrhoid in past.  Saw bleeding in past but not in a while.  Can cause pain, itching.  Itching daily even between BMs.   Having BMs does not cause pain, but itching can be worse.  Uncommonly constipated.  Never had colonoscopy.  Both her parents have had polyps but no cancer.  Labs 03/2013 CBC, cmet were normal.  Review of systems: Pertinent positive and negative review of systems were noted in the above HPI section. Complete review of systems was performed and was otherwise normal.    Past Medical History  Diagnosis Date  . Impaired glucose tolerance   . HYPERLIPIDEMIA   . GERD   . ANXIETY   . ALLERGIC RHINITIS   . Tachycardia   . Gestational diabetes     Past Surgical History  Procedure Laterality Date  . Tonsillectomy and adenoidectomy    . Cesarean section  2003/2008    x 2  . Nasal sinus surgery  1993    Current Outpatient Prescriptions  Medication Sig Dispense Refill  . hydrocortisone (ANUSOL-HC) 25 MG suppository Place 1 suppository (25 mg total) rectally every 12 (twelve) hours.  12 suppository  1  . metoprolol tartrate (LOPRESSOR) 25 MG tablet Take 1 tablet (25 mg total) by mouth daily.  90 tablet  3   No current facility-administered medications for this visit.    Allergies as of 05/18/2013 - Review Complete 05/18/2013  Allergen Reaction Noted  . Ciprofloxacin  09/01/2006  . Sulfonamide derivatives  09/01/2006    Family History  Problem Relation Age of Onset  . Breast cancer Mother 65  . Colon polyps Mother   . Colon polyps Father   . Diabetes Father   . Heart disease Father   . Liver disease Father   . Parkinson's disease Mother     History   Social History  . Marital Status: Divorced    Spouse Name: N/A    Number of Children: 2  . Years of Education: N/A   Occupational History  .  TEACHER    Social History Main Topics  . Smoking status: Never Smoker   . Smokeless tobacco: Never Used  . Alcohol Use: No     Comment: occasional-2 per month  . Drug Use: No  . Sexual Activity: Not on file   Other Topics Concern  . Not on file   Social History Narrative  . No narrative on file       Physical Exam: Ht 4' 11.5" (1.511 m)  Wt 174 lb 6 oz (79.096 kg)  BMI 34.64 kg/m2 Constitutional: generally well-appearing Psychiatric: alert and oriented x3 Eyes: extraocular movements intact Mouth: oral pharynx moist, no lesions Neck: supple no lymphadenopathy Cardiovascular: heart regular rate and rhythm Lungs: clear to auscultation bilaterally Abdomen: soft, nontender, nondistended, no obvious ascites, no peritoneal signs, normal bowel sounds Extremities: no lower extremity edema bilaterally Skin: no lesions on visible extremities Rectal exam:  Very small single external hemorrhoid was not thrombosed, no clear anal fissures, perianal skin was a bit shiny but not really erythematous, no distal rectal masses, stool was not checked for Hemoccult   Assessment and plan: 43 y.o. female with  minor rectal bleeding, small external hemorrhoid, anal itching   Going to give her prescription strength topical hydrocortisone with lidocaine for the itching periodically. I would  like to see with colonoscopy at her soonest convenience to exclude other potential causes of her minor rectal bleeding which I think are unlikely.

## 2013-05-19 ENCOUNTER — Encounter: Payer: Self-pay | Admitting: Gastroenterology

## 2013-07-16 ENCOUNTER — Encounter: Payer: BC Managed Care – PPO | Admitting: Gastroenterology

## 2013-11-03 ENCOUNTER — Other Ambulatory Visit: Payer: Self-pay

## 2013-11-03 DIAGNOSIS — Z1231 Encounter for screening mammogram for malignant neoplasm of breast: Secondary | ICD-10-CM

## 2013-12-15 ENCOUNTER — Ambulatory Visit
Admission: RE | Admit: 2013-12-15 | Discharge: 2013-12-15 | Disposition: A | Discharge status: Home / Self Care | Payer: BC Managed Care – PPO | Source: Ambulatory Visit

## 2013-12-15 DIAGNOSIS — Z1231 Encounter for screening mammogram for malignant neoplasm of breast: Secondary | ICD-10-CM

## 2013-12-15 LAB — HM MAMMOGRAPHY

## 2014-02-23 ENCOUNTER — Encounter: Payer: Self-pay | Admitting: Internal Medicine

## 2014-02-23 ENCOUNTER — Ambulatory Visit (INDEPENDENT_AMBULATORY_CARE_PROVIDER_SITE_OTHER): Payer: BC Managed Care – PPO | Admitting: Internal Medicine

## 2014-02-23 VITALS — BP 124/88 | HR 98 | Temp 98.0°F | Ht 60.0 in | Wt 181.5 lb

## 2014-02-23 DIAGNOSIS — J019 Acute sinusitis, unspecified: Secondary | ICD-10-CM | POA: Insufficient documentation

## 2014-02-23 MED ORDER — METOPROLOL TARTRATE 25 MG PO TABS: 25.0000 mg | ORAL_TABLET | Freq: Every day | ORAL | Status: DC

## 2014-02-23 MED ORDER — CLARITHROMYCIN 250 MG PO TABS: 250.0000 mg | ORAL_TABLET | Freq: Two times a day (BID) | ORAL | Status: DC

## 2014-02-23 MED ORDER — LEVOFLOXACIN 500 MG PO TABS: 500.0000 mg | ORAL_TABLET | Freq: Every day | ORAL | Status: DC

## 2014-02-23 MED ORDER — HYDROCODONE-HOMATROPINE 5-1.5 MG/5ML PO SYRP: 5.0000 mL | ORAL_SOLUTION | Freq: Four times a day (QID) | ORAL | Status: DC | PRN

## 2014-02-23 NOTE — Progress Notes (Signed)
Pre visit review using our clinic review tool, if applicable. No additional management support is needed unless otherwise documented below in the visit note. 

## 2014-02-23 NOTE — Progress Notes (Signed)
   Subjective:    Patient ID: [name], female    DOB: Jan 01, 1971, 44 y.o.   MRN: [id number]  HPI   Here with 2-3 days acute onset fever, facial pain, pressure, headache, general weakness and malaise, and greenish d/c, with mild ST and cough, but pt denies chest pain, wheezing, increased sob or doe, orthopnea, PND, increased LE swelling, palpitations, dizziness or syncope.  Recently tx with amoxil course for upper resp symtpoms. But little help, and right ear and neck pain worse, Past Medical History  Diagnosis Date  . Impaired glucose tolerance   . HYPERLIPIDEMIA   . GERD   . ANXIETY   . ALLERGIC RHINITIS   . Tachycardia   . Gestational diabetes    Past Surgical History  Procedure Laterality Date  . Tonsillectomy and adenoidectomy    . Cesarean section  2003/2008    x 2  . Nasal sinus surgery  1993    reports that she has never smoked. She has never used smokeless tobacco. She reports that she does not drink alcohol or use illicit drugs. family history includes Breast cancer (age of onset: 70) in her mother; Colon polyps in her father and mother; Diabetes in her father; Heart disease in her father; Liver disease in her father; Parkinson's disease in her mother. Allergies  Allergen Reactions  . Ciprofloxacin   . Sulfonamide Derivatives    Current Outpatient Prescriptions on File Prior to Visit  Medication Sig Dispense Refill  . hydrocortisone-pramoxine (ANALPRAM-HC) 2.5-1 % rectal cream Place 1 application rectally daily. For itching, pain (Patient not taking: Reported on 02/23/2014) 30 g 5   No current facility-administered medications on file prior to visit.   Review of Systems  Constitutional: Negative for unusual diaphoresis or other sweats  HENT: Negative for ringing in ear Eyes: Negative for double vision or worsening visual disturbance.  Respiratory: Negative for choking and stridor.   Gastrointestinal: Negative for vomiting or other signifcant bowel  change Genitourinary: Negative for hematuria or decreased urine volume.  Musculoskeletal: Negative for other MSK pain or swelling Skin: Negative for color change and worsening wound.  Neurological: Negative for tremors and numbness other than noted  Psychiatric/Behavioral: Negative for decreased concentration or agitation other than above       Objective:   Physical Exam BP 124/88 mmHg  Pulse 98  Temp(Src) 98 F (36.7 C) (Oral)  Ht 5' (1.524 m)  Wt 181 lb 8 oz (82.328 kg)  BMI 35.45 kg/m2  SpO2 93% VS noted, mild ill Constitutional: Pt appears well-developed, well-nourished.  HENT: Head: NCAT.  Right Ear: External ear normal.  Left Ear: External ear normal.  Eyes: . Pupils are equal, round, and reactive to light. Conjunctivae and EOM are normal Bilat tm's with mild erythema.  Max sinus areas mild tender.  Pharynx with mild erythema, no exudate Neck: Normal range of motion. Neck supple.  Cardiovascular: Normal rate and regular rhythm.   Pulmonary/Chest: Effort normal and breath sounds without rales or wheezing.  Neurological: Pt is alert. Not confused , motor grossly intact Skin: Skin is warm. No rash Psychiatric: Pt behavior is normal. No agitation.     Assessment & Plan:

## 2014-02-23 NOTE — Assessment & Plan Note (Signed)
Mild to mod, for antibx course,  to f/u any worsening symptoms or concerns 

## 2014-02-23 NOTE — Patient Instructions (Signed)
Please take all new medication as prescribed - the antibiotic and cough medicine  You can also take  Mucinex D (or it's generic off brand) for congestion, and tylenol as needed for pain.  Please continue all other medications as before, and refills have been done if requested.  Please have the pharmacy call with any other refills you may need.  Please keep your appointments with your specialists as you may have planned

## 2014-03-23 ENCOUNTER — Other Ambulatory Visit: Payer: Self-pay | Admitting: Internal Medicine

## 2014-04-12 ENCOUNTER — Other Ambulatory Visit (INDEPENDENT_AMBULATORY_CARE_PROVIDER_SITE_OTHER): Payer: BC Managed Care – PPO

## 2014-04-12 DIAGNOSIS — Z0000 Encounter for general adult medical examination without abnormal findings: Secondary | ICD-10-CM

## 2014-04-12 LAB — HEPATIC FUNCTION PANEL
ALK PHOS: 55 U/L (ref 39–117)
ALT: 19 U/L (ref 0–35)
AST: 19 U/L (ref 0–37)
Albumin: 4.1 g/dL (ref 3.5–5.2)
Bilirubin, Direct: 0.1 mg/dL (ref 0.0–0.3)
Total Bilirubin: 0.4 mg/dL (ref 0.2–1.2)
Total Protein: 7 g/dL (ref 6.0–8.3)

## 2014-04-12 LAB — BASIC METABOLIC PANEL
BUN: 9 mg/dL (ref 6–23)
CALCIUM: 9.6 mg/dL (ref 8.4–10.5)
CO2: 30 mEq/L (ref 19–32)
CREATININE: 0.57 mg/dL (ref 0.40–1.20)
Chloride: 101 mEq/L (ref 96–112)
GFR: 122.64 mL/min (ref >60.00)
Glucose, Bld: 112 mg/dL — ABNORMAL HIGH (ref 70–99)
Potassium: 3.8 mEq/L (ref 3.5–5.1)
Sodium: 138 mEq/L (ref 135–145)

## 2014-04-12 LAB — CBC WITH DIFFERENTIAL/PLATELET
Basophils Absolute: 0.1 10*3/uL (ref 0.0–0.1)
Basophils Relative: 0.7 % (ref 0.0–3.0)
EOS ABS: 0.6 10*3/uL (ref 0.0–0.7)
Eosinophils Relative: 5.5 % — ABNORMAL HIGH (ref 0.0–5.0)
HEMATOCRIT: 39.1 % (ref 36.0–46.0)
Hemoglobin: 13.5 g/dL (ref 12.0–15.0)
LYMPHS PCT: 17.3 % (ref 12.0–46.0)
Lymphs Abs: 1.8 10*3/uL (ref 0.7–4.0)
MCHC: 34.5 g/dL (ref 30.0–36.0)
MCV: 85.8 fl (ref 78.0–100.0)
MONO ABS: 0.7 10*3/uL (ref 0.1–1.0)
Monocytes Relative: 6.4 % (ref 3.0–12.0)
Neutro Abs: 7.1 10*3/uL (ref 1.4–7.7)
Neutrophils Relative %: 70.1 % (ref 43.0–77.0)
PLATELETS: 317 10*3/uL (ref 150.0–400.0)
RBC: 4.56 Mil/uL (ref 3.87–5.11)
RDW: 12.6 % (ref 11.5–15.5)
WBC: 10.1 10*3/uL (ref 4.0–10.5)

## 2014-04-12 LAB — LIPID PANEL
Cholesterol: 180 mg/dL (ref 0–200)
HDL: 55.6 mg/dL (ref >39.00)
LDL Cholesterol: 110 mg/dL — ABNORMAL HIGH (ref 0–99)
NonHDL: 124.4
Total CHOL/HDL Ratio: 3
Triglycerides: 70 mg/dL (ref 0.0–149.0)
VLDL: 14 mg/dL (ref 0.0–40.0)

## 2014-04-12 LAB — URINALYSIS, ROUTINE W REFLEX MICROSCOPIC
Bilirubin Urine: NEGATIVE
Hgb urine dipstick: NEGATIVE
Ketones, ur: NEGATIVE
LEUKOCYTES UA: NEGATIVE
Nitrite: NEGATIVE
Specific Gravity, Urine: 1.01 (ref 1.000–1.030)
Total Protein, Urine: NEGATIVE
Urine Glucose: NEGATIVE
Urobilinogen, UA: 0.2 (ref 0.0–1.0)
pH: 7 (ref 5.0–8.0)

## 2014-04-12 LAB — TSH: TSH: 1.7 u[IU]/mL (ref 0.35–4.50)

## 2014-04-19 ENCOUNTER — Ambulatory Visit (INDEPENDENT_AMBULATORY_CARE_PROVIDER_SITE_OTHER): Payer: BC Managed Care – PPO | Admitting: Internal Medicine

## 2014-04-19 ENCOUNTER — Encounter: Payer: Self-pay | Admitting: Internal Medicine

## 2014-04-19 VITALS — BP 120/76 | HR 60 | Temp 98.3°F | Resp 18 | Ht 60.0 in | Wt 179.2 lb

## 2014-04-19 DIAGNOSIS — Z0000 Encounter for general adult medical examination without abnormal findings: Secondary | ICD-10-CM | POA: Diagnosis not present

## 2014-04-19 DIAGNOSIS — Z0001 Encounter for general adult medical examination with abnormal findings: Secondary | ICD-10-CM | POA: Insufficient documentation

## 2014-04-19 NOTE — Patient Instructions (Signed)
Please continue all other medications as before, and refills have been done if requested.  Please have the pharmacy call with any other refills you may need.  Please continue your efforts at being more active, low cholesterol diet, and weight control.  You are otherwise up to date with prevention measures today.  Please keep your appointments with your specialists as you may have planned  Please return in 1 year for your yearly visit, or sooner if needed, with Lab testing done 3-5 days before  

## 2014-04-19 NOTE — Progress Notes (Signed)
Pre visit review using our clinic review tool, if applicable. No additional management support is needed unless otherwise documented below in the visit note. 

## 2014-04-19 NOTE — Progress Notes (Signed)
Subjective:    Patient ID: [name], female    DOB: [birth year], 44 y.o.   MRN: [id number]  HPI  Here for wellness and f/u;  Overall doing ok;  Pt denies Chest pain, worsening SOB, DOE, wheezing, orthopnea, PND, worsening LE edema, palpitations, dizziness or syncope.  Pt denies neurological change such as new headache, facial or extremity weakness.  Pt denies polydipsia, polyuria, or low sugar symptoms. Pt states overall good compliance with treatment and medications, good tolerability, and has been trying to follow appropriate diet.  Pt denies worsening depressive symptoms, suicidal ideation or panic. No fever, night sweats, wt loss, loss of appetite, or other constitutional symptoms.  Pt states good ability with ADL's, has low fall risk, home safety reviewed and adequate, no other significant changes in hearing or vision, and only occasionally active with exercise. Wt Readings from Last 3 Encounters:  04/19/14 179 lb 3.2 oz (81.285 kg)  02/23/14 181 lb 8 oz (82.328 kg)  05/18/13 174 lb 6 oz (79.096 kg)   / Past Medical History  Diagnosis Date  . Impaired glucose tolerance   . HYPERLIPIDEMIA   . GERD   . ANXIETY   . ALLERGIC RHINITIS   . Tachycardia   . Gestational diabetes    Past Surgical History  Procedure Laterality Date  . Tonsillectomy and adenoidectomy    . Cesarean section  2003/2008    x 2  . Nasal sinus surgery  1993    reports that she has never smoked. She has never used smokeless tobacco. She reports that she does not drink alcohol or use illicit drugs. family history includes Breast cancer (age of onset: 40) in her mother; Colon polyps in her father and mother; Diabetes in her father; Heart disease in her father; Liver disease in her father; Parkinson's disease in her mother. Allergies  Allergen Reactions  . Ciprofloxacin   . Sulfonamide Derivatives    Current Outpatient Prescriptions on File Prior to Visit  Medication Sig Dispense Refill  .  hydrocortisone-pramoxine (ANALPRAM-HC) 2.5-1 % rectal cream Place 1 application rectally daily. For itching, pain 30 g 5  . metoprolol tartrate (LOPRESSOR) 25 MG tablet Take 1 tablet (25 mg total) by mouth daily. 90 tablet 3   No current facility-administered medications on file prior to visit.   Review of Systems Constitutional: Negative for increased diaphoresis, other activity, appetite or siginficant weight change other than noted HENT: Negative for worsening hearing loss, ear pain, facial swelling, mouth sores and neck stiffness.   Eyes: Negative for other worsening pain, redness or visual disturbance.  Respiratory: Negative for shortness of breath and wheezing  Cardiovascular: Negative for chest pain and palpitations.  Gastrointestinal: Negative for diarrhea, blood in stool, abdominal distention or other pain Genitourinary: Negative for hematuria, flank pain or change in urine volume.  Musculoskeletal: Negative for myalgias or other joint complaints.  Skin: Negative for color change and wound or drainage.  Neurological: Negative for syncope and numbness. other than noted Hematological: Negative for adenopathy. or other swelling Psychiatric/Behavioral: Negative for hallucinations, SI, self-injury, decreased concentration or other worsening agitation.      Objective:   Physical Exam BP 120/76 mmHg  Pulse 60  Temp(Src) 98.3 F (36.8 C) (Oral)  Resp 18  Ht 5' (1.524 m)  Wt 179 lb 3.2 oz (81.285 kg)  BMI 35.00 kg/m2  SpO2 95%  LMP 04/05/2014 VS noted,  Constitutional: Pt is oriented to person, place, and time. Appears well-developed and well-nourished, in no significant distress/obese  Head: Normocephalic and atraumatic.  Right Ear: External ear normal.  Left Ear: External ear normal.  Nose: Nose normal.  Mouth/Throat: Oropharynx is clear and moist.  Eyes: Conjunctivae and EOM are normal. Pupils are equal, round, and reactive to light.  Neck: Normal range of motion. Neck  supple. No JVD present. No tracheal deviation present or significant neck LA or mass Cardiovascular: Normal rate, regular rhythm, normal heart sounds and intact distal pulses.   Pulmonary/Chest: Effort normal and breath sounds without rales or wheezing  Abdominal: Soft. Bowel sounds are normal. NT. No HSM  Musculoskeletal: Normal range of motion. Exhibits no edema.  Lymphadenopathy:  Has no cervical adenopathy.  Neurological: Pt is alert and oriented to person, place, and time. Pt has normal reflexes. No cranial nerve deficit. Motor grossly intact Skin: Skin is warm and dry. No rash noted.  Psychiatric:  Has normal mood and affect. Behavior is normal.     Assessment & Plan:

## 2014-04-19 NOTE — Assessment & Plan Note (Signed)

## 2014-11-10 ENCOUNTER — Other Ambulatory Visit: Payer: Self-pay

## 2014-11-10 DIAGNOSIS — Z1231 Encounter for screening mammogram for malignant neoplasm of breast: Secondary | ICD-10-CM

## 2014-12-19 ENCOUNTER — Ambulatory Visit: Payer: BC Managed Care – PPO

## 2014-12-27 ENCOUNTER — Ambulatory Visit (INDEPENDENT_AMBULATORY_CARE_PROVIDER_SITE_OTHER): Payer: BC Managed Care – PPO | Admitting: Family

## 2014-12-27 ENCOUNTER — Encounter: Payer: Self-pay | Admitting: Family

## 2014-12-27 VITALS — BP 130/96 | HR 91 | Temp 98.1°F | Resp 18 | Ht 60.0 in | Wt 187.0 lb

## 2014-12-27 DIAGNOSIS — J019 Acute sinusitis, unspecified: Secondary | ICD-10-CM | POA: Insufficient documentation

## 2014-12-27 DIAGNOSIS — J329 Chronic sinusitis, unspecified: Secondary | ICD-10-CM | POA: Insufficient documentation

## 2014-12-27 MED ORDER — AMOXICILLIN-POT CLAVULANATE 875-125 MG PO TABS: 1.0000 | ORAL_TABLET | Freq: Two times a day (BID) | ORAL | Status: DC

## 2014-12-27 MED ORDER — FLUCONAZOLE 150 MG PO TABS: 150.0000 mg | ORAL_TABLET | Freq: Once | ORAL | Status: DC

## 2014-12-27 NOTE — Progress Notes (Signed)
Pre visit review using our clinic review tool, if applicable. No additional management support is needed unless otherwise documented below in the visit note. 

## 2014-12-27 NOTE — Assessment & Plan Note (Signed)
Symptoms and exam consistent with bacterial sinusitis. Start Augmentin. Start Diflucan as needed for post antibiotic candidiasis. Continue over-the-counter medications as needed for symptom relief and supportive care. Follow-up if symptoms worsen or fail to improve.

## 2014-12-27 NOTE — Patient Instructions (Signed)
Thank you for choosing [facility].  Summary/Instructions:  Your prescription(s) have been submitted to your pharmacy or been printed and provided for you. Please take as directed and contact our office if you believe you are having problem(s) with the medication(s) or have any questions.  If your symptoms worsen or fail to improve, please contact our office for further instruction, or in case of emergency go directly to the emergency room at the closest medical facility.   General Recommendations:    Please drink plenty of fluids.  Get plenty of rest   Sleep in humidified air  Use saline nasal sprays  Netti pot   OTC Medications:  Decongestants - helps relieve congestion   Flonase (generic fluticasone) or Nasacort (generic triamcinolone) - please make sure to use the "cross-over" technique at a 45 degree angle towards the opposite eye as opposed to straight up the nasal passageway.   Sudafed (generic pseudoephedrine - Note this is the one that is available behind the pharmacy counter); Products with phenylephrine (-PE) may also be used but is often not as effective as pseudoephedrine.   If you have HIGH BLOOD PRESSURE - Coricidin HBP; AVOID any product that is -D as this contains pseudoephedrine which may increase your blood pressure.  Afrin (oxymetazoline) every 6-8 hours for up to 3 days.   Allergies - helps relieve runny nose, itchy eyes and sneezing   Claritin (generic loratidine), Allegra (fexofenidine), or Zyrtec (generic cyrterizine) for runny nose. These medications should not cause drowsiness.  Note - Benadryl (generic diphenhydramine) may be used however may cause drowsiness  Cough -   Delsym or Robitussin (generic dextromethorphan)  Expectorants - helps loosen mucus to ease removal   Mucinex (generic guaifenesin) as directed on the package.  Headaches / General Aches   Tylenol (generic acetaminophen) - DO NOT EXCEED 3 grams (3,000 mg) in a 24  hour time period  Advil/Motrin (generic ibuprofen)   Sore Throat -   Salt water gargle   Chloraseptic (generic benzocaine) spray or lozenges / Sucrets (generic dyclonine)      

## 2014-12-27 NOTE — Progress Notes (Signed)
   Subjective:    Patient ID: [name], female    DOB: [birth year], 44 y.o.   MRN: [id number]  Chief Complaint  Patient presents with  . Nasal Congestion    for several weeks she hasn't felt well, congestion, low grade temp, sinus pressure, little bit of cough    HPI:  [name] is a 44 y.o. female who  has a past medical history of Impaired glucose tolerance; HYPERLIPIDEMIA; GERD; ANXIETY; ALLERGIC RHINITIS; Tachycardia; and Gestational diabetes. and presents today for an acute office visit.   This is a new problem. Associated symptom of malaise, congestion, low grade temp, sinus pressure and a slight cough has been going on for several weeks. Modifying factors include Mucinex and nasal sprays which not helped with the symptoms. T-max about 100 and the last fever was yesterday. Denies any recent antibiotic use.    Allergies  Allergen Reactions  . Ciprofloxacin   . Sulfonamide Derivatives      Current Outpatient Prescriptions on File Prior to Visit  Medication Sig Dispense Refill  . hydrocortisone-pramoxine (ANALPRAM-HC) 2.5-1 % rectal cream Place 1 application rectally daily. For itching, pain 30 g 5  . metoprolol tartrate (LOPRESSOR) 25 MG tablet Take 1 tablet (25 mg total) by mouth daily. 90 tablet 3   No current facility-administered medications on file prior to visit.    Review of Systems  Constitutional: Positive for fever. Negative for chills.  HENT: Positive for congestion, ear pain and sinus pressure. Negative for sore throat.   Respiratory: Positive for cough. Negative for chest tightness and shortness of breath.   Gastrointestinal: Negative for nausea, vomiting and diarrhea.  Neurological: Positive for headaches.      Objective:    BP 130/96 mmHg  Pulse 91  Temp(Src) 98.1 F (36.7 C) (Oral)  Resp 18  Ht 5' (1.524 m)  Wt 187 lb (84.823 kg)  BMI 36.52 kg/m2  SpO2 97% Nursing note and vital signs reviewed.  Physical Exam  Constitutional: She is  oriented to person, place, and time. She appears well-developed and well-nourished. No distress.  HENT:  Right Ear: Hearing, tympanic membrane, external ear and ear canal normal.  Left Ear: Hearing, tympanic membrane, external ear and ear canal normal.  Nose: Right sinus exhibits maxillary sinus tenderness and frontal sinus tenderness. Left sinus exhibits maxillary sinus tenderness and frontal sinus tenderness.  Mouth/Throat: Uvula is midline, oropharynx is clear and moist and mucous membranes are normal.  Neck: Neck supple.  Cardiovascular: Normal rate, regular rhythm, normal heart sounds and intact distal pulses.   Pulmonary/Chest: Effort normal and breath sounds normal.  Neurological: She is alert and oriented to person, place, and time.  Skin: Skin is warm and dry.  Psychiatric: She has a normal mood and affect. Her behavior is normal. Judgment and thought content normal.       Assessment & Plan:   Problem List Items Addressed This Visit      Respiratory   Sinusitis, acute - Primary    Symptoms and exam consistent with bacterial sinusitis. Start Augmentin. Start Diflucan as needed for post antibiotic candidiasis. Continue over-the-counter medications as needed for symptom relief and supportive care. Follow-up if symptoms worsen or fail to improve.      Relevant Medications   amoxicillin-clavulanate (AUGMENTIN) 875-125 MG tablet   fluconazole (DIFLUCAN) 150 MG tablet

## 2015-01-12 ENCOUNTER — Ambulatory Visit
Admission: RE | Admit: 2015-01-12 | Discharge: 2015-01-12 | Disposition: A | Discharge status: Home / Self Care | Payer: BC Managed Care – PPO | Source: Ambulatory Visit

## 2015-01-12 DIAGNOSIS — Z1231 Encounter for screening mammogram for malignant neoplasm of breast: Secondary | ICD-10-CM

## 2015-01-17 ENCOUNTER — Other Ambulatory Visit: Payer: Self-pay | Admitting: Obstetrics & Gynecology

## 2015-01-17 DIAGNOSIS — R928 Other abnormal and inconclusive findings on diagnostic imaging of breast: Secondary | ICD-10-CM

## 2015-01-20 ENCOUNTER — Ambulatory Visit
Admission: RE | Admit: 2015-01-20 | Discharge: 2015-01-20 | Disposition: A | Discharge status: Home / Self Care | Payer: BC Managed Care – PPO | Source: Ambulatory Visit | Attending: Obstetrics & Gynecology | Admitting: Obstetrics & Gynecology

## 2015-01-20 DIAGNOSIS — R928 Other abnormal and inconclusive findings on diagnostic imaging of breast: Secondary | ICD-10-CM

## 2015-01-27 ENCOUNTER — Telehealth: Payer: Self-pay

## 2015-01-27 NOTE — Telephone Encounter (Signed)
Left message advising patient to call back to schedule nurse visit for flu vaccine

## 2015-03-02 ENCOUNTER — Encounter: Payer: Self-pay | Admitting: Internal Medicine

## 2015-03-02 ENCOUNTER — Ambulatory Visit (INDEPENDENT_AMBULATORY_CARE_PROVIDER_SITE_OTHER): Payer: BC Managed Care – PPO | Admitting: Internal Medicine

## 2015-03-02 VITALS — BP 126/72 | HR 101 | Temp 98.0°F | Resp 20 | Wt 167.0 lb

## 2015-03-02 DIAGNOSIS — R7302 Impaired glucose tolerance (oral): Secondary | ICD-10-CM | POA: Diagnosis not present

## 2015-03-02 DIAGNOSIS — J019 Acute sinusitis, unspecified: Secondary | ICD-10-CM | POA: Diagnosis not present

## 2015-03-02 DIAGNOSIS — J069 Acute upper respiratory infection, unspecified: Secondary | ICD-10-CM

## 2015-03-02 DIAGNOSIS — R21 Rash and other nonspecific skin eruption: Secondary | ICD-10-CM | POA: Insufficient documentation

## 2015-03-02 MED ORDER — FLUCONAZOLE 150 MG PO TABS: 150.0000 mg | ORAL_TABLET | Freq: Once | ORAL | Status: DC

## 2015-03-02 MED ORDER — LEVOFLOXACIN 500 MG PO TABS: 500.0000 mg | ORAL_TABLET | Freq: Every day | ORAL | Status: DC

## 2015-03-02 MED ORDER — TRIAMCINOLONE ACETONIDE 0.1 % EX CREA: 1.0000 "application " | TOPICAL_CREAM | Freq: Two times a day (BID) | CUTANEOUS | Status: DC

## 2015-03-02 NOTE — Assessment & Plan Note (Signed)
Mild to mod, for triam cr prn,  to f/u any worsening symptoms or concerns 

## 2015-03-02 NOTE — Assessment & Plan Note (Signed)
stable overall by history and exam, recent data reviewed with pt, and pt to continue medical treatment as before,  to f/u any worsening symptoms or concerns Lab Results  Component Value Date   HGBA1C 5.8 04/09/2007   To cont work on diet and wt loss

## 2015-03-02 NOTE — Patient Instructions (Signed)
Please take all new medication as prescribed - the antibiotic, and the steroid cream to use as needed  You can also take Delsym OTC for cough, and/or Mucinex (or it's generic off brand) for congestion, and tylenol as needed for pain.  Please continue all other medications as before, and refills have been done if requested.  Please have the pharmacy call with any other refills you may need.  Please keep your appointments with your specialists as you may have planned

## 2015-03-02 NOTE — Assessment & Plan Note (Signed)
Mild to mod, for antibx course,  to f/u any worsening symptoms or concerns 

## 2015-03-02 NOTE — Progress Notes (Signed)
Pre visit review using our clinic review tool, if applicable. No additional management support is needed unless otherwise documented below in the visit note. 

## 2015-03-02 NOTE — Progress Notes (Signed)
   Subjective:    Patient ID: [name], female    DOB: 1970-10-10, 45 y.o.   MRN: [id number]  HPI     Here with 2-3 days acute onset fever, facial pain, bilat left > right ear  Pain and pressure, headache, general weakness and malaise, and greenish d/c, with mild ST and cough, but pt denies chest pain, wheezing, increased sob or doe, orthopnea, PND, increased LE swelling, palpitations, dizziness or syncope.   Pt denies polydipsia, polyuria. Pt denies new neurological symptoms such as new headache, or facial or extremity weakness or numbness  Also with 1 wk worsening irritation right medial thigh with itch not better with desitin  Past Medical History  Diagnosis Date  . Impaired glucose tolerance   . HYPERLIPIDEMIA   . GERD   . ANXIETY   . ALLERGIC RHINITIS   . Tachycardia   . Gestational diabetes    Past Surgical History  Procedure Laterality Date  . Tonsillectomy and adenoidectomy    . Cesarean section  2003/2008    x 2  . Nasal sinus surgery  1993    reports that she has never smoked. She has never used smokeless tobacco. She reports that she does not drink alcohol or use illicit drugs. family history includes Breast cancer (age of onset: 27) in her mother; Colon polyps in her father and mother; Diabetes in her father; Heart disease in her father; Liver disease in her father; Parkinson's disease in her mother. Allergies  Allergen Reactions  . Ciprofloxacin   . Sulfonamide Derivatives    Current Outpatient Prescriptions on File Prior to Visit  Medication Sig Dispense Refill  . hydrocortisone-pramoxine (ANALPRAM-HC) 2.5-1 % rectal cream Place 1 application rectally daily. For itching, pain 30 g 5  . metoprolol tartrate (LOPRESSOR) 25 MG tablet Take 1 tablet (25 mg total) by mouth daily. 90 tablet 3   No current facility-administered medications on file prior to visit.   Review of Systems  Constitutional: Negative for unusual diaphoresis or night sweats HENT: Negative for  ringing in ear or discharge Eyes: Negative for double vision or worsening visual disturbance.  Respiratory: Negative for choking and stridor.   Gastrointestinal: Negative for vomiting or other signifcant bowel change Genitourinary: Negative for hematuria or change in urine volume.  Musculoskeletal: Negative for other MSK pain or swelling Skin: Negative for color change and worsening wound.  Neurological: Negative for tremors and numbness other than noted  Psychiatric/Behavioral: Negative for decreased concentration or agitation other than above       Objective:   Physical Exam BP 126/72 mmHg  Pulse 101  Temp(Src) 98 F (36.7 C) (Oral)  Resp 20  Wt 167 lb (75.751 kg)  SpO2 97% VS noted, mild ill Constitutional: Pt appears in no significant distress HENT: Head: NCAT.  Right Ear: External ear normal.  Left Ear: External ear normal.  Bilat tm's with mod to severe erythema.  Max sinus areas mild tender.  Pharynx with mild erythema, no exudate Eyes: . Pupils are equal, round, and reactive to light. Conjunctivae and EOM are normal Neck: Normal range of motion. Neck supple.  Cardiovascular: Normal rate and regular rhythm.   Pulmonary/Chest: Effort normal and breath sounds without rales or wheezing.  Neurological: Pt is alert. Not confused , motor grossly intact Skin: Skin is warm. No rash, no LE edema Psychiatric: Pt behavior is normal. No agitation.     Assessment & Plan:

## 2015-04-18 ENCOUNTER — Telehealth: Payer: Self-pay | Admitting: Internal Medicine

## 2015-04-18 NOTE — Telephone Encounter (Signed)
Pt rescheduled her physical from April to June.  Her lab orders expire in May. Can you please re order her labs Please advise

## 2015-04-18 NOTE — Telephone Encounter (Signed)
Please advise, can we do this 

## 2015-04-18 NOTE — Telephone Encounter (Signed)
Labs are extended now,, thanks

## 2015-04-25 ENCOUNTER — Other Ambulatory Visit: Payer: Self-pay

## 2015-04-25 MED ORDER — METOPROLOL TARTRATE 25 MG PO TABS: 25.0000 mg | ORAL_TABLET | Freq: Every day | ORAL | Status: DC

## 2015-04-27 ENCOUNTER — Encounter: Payer: Self-pay | Admitting: Internal Medicine

## 2015-04-27 ENCOUNTER — Ambulatory Visit (INDEPENDENT_AMBULATORY_CARE_PROVIDER_SITE_OTHER): Payer: BC Managed Care – PPO | Admitting: Internal Medicine

## 2015-04-27 ENCOUNTER — Other Ambulatory Visit (INDEPENDENT_AMBULATORY_CARE_PROVIDER_SITE_OTHER): Payer: BC Managed Care – PPO

## 2015-04-27 ENCOUNTER — Ambulatory Visit
Admission: RE | Admit: 2015-04-27 | Discharge: 2015-04-27 | Disposition: A | Discharge status: Home / Self Care | Payer: BC Managed Care – PPO | Source: Ambulatory Visit | Attending: Internal Medicine | Admitting: Internal Medicine

## 2015-04-27 ENCOUNTER — Other Ambulatory Visit: Payer: Self-pay | Admitting: Internal Medicine

## 2015-04-27 VITALS — BP 124/76 | HR 110 | Temp 99.1°F | Resp 20 | Wt 168.0 lb

## 2015-04-27 DIAGNOSIS — N3000 Acute cystitis without hematuria: Secondary | ICD-10-CM | POA: Diagnosis not present

## 2015-04-27 DIAGNOSIS — R7302 Impaired glucose tolerance (oral): Secondary | ICD-10-CM

## 2015-04-27 DIAGNOSIS — R300 Dysuria: Secondary | ICD-10-CM | POA: Diagnosis not present

## 2015-04-27 DIAGNOSIS — R509 Fever, unspecified: Secondary | ICD-10-CM | POA: Diagnosis not present

## 2015-04-27 DIAGNOSIS — N390 Urinary tract infection, site not specified: Secondary | ICD-10-CM | POA: Insufficient documentation

## 2015-04-27 DIAGNOSIS — F411 Generalized anxiety disorder: Secondary | ICD-10-CM

## 2015-04-27 LAB — URINALYSIS, ROUTINE W REFLEX MICROSCOPIC
Bilirubin Urine: NEGATIVE
Hgb urine dipstick: NEGATIVE
KETONES UR: NEGATIVE
LEUKOCYTES UA: NEGATIVE
Nitrite: NEGATIVE
PH: 8.5 — AB (ref 5.0–8.0)
SPECIFIC GRAVITY, URINE: 1.01 (ref 1.000–1.030)
Total Protein, Urine: NEGATIVE
URINE GLUCOSE: NEGATIVE
UROBILINOGEN UA: 0.2 (ref 0.0–1.0)

## 2015-04-27 LAB — POCT URINALYSIS DIPSTICK
Bilirubin, UA: NEGATIVE
Glucose, UA: NEGATIVE
KETONES UA: NEGATIVE
Leukocytes, UA: NEGATIVE
Nitrite, UA: NEGATIVE
PH UA: 7.5
PROTEIN UA: NEGATIVE
RBC UA: NEGATIVE
SPEC GRAV UA: 1.02
Urobilinogen, UA: NEGATIVE

## 2015-04-27 LAB — CBC WITH DIFFERENTIAL/PLATELET
BASOS ABS: 0.1 10*3/uL (ref 0.0–0.1)
Basophils Relative: 0.6 % (ref 0.0–3.0)
EOS ABS: 0.2 10*3/uL (ref 0.0–0.7)
Eosinophils Relative: 1.9 % (ref 0.0–5.0)
HCT: 37.6 % (ref 36.0–46.0)
Hemoglobin: 12.7 g/dL (ref 12.0–15.0)
Lymphocytes Relative: 12 % (ref 12.0–46.0)
Lymphs Abs: 1.4 10*3/uL (ref 0.7–4.0)
MCHC: 33.9 g/dL (ref 30.0–36.0)
MCV: 86 fl (ref 78.0–100.0)
MONO ABS: 0.6 10*3/uL (ref 0.1–1.0)
Monocytes Relative: 4.9 % (ref 3.0–12.0)
NEUTROS PCT: 80.6 % — AB (ref 43.0–77.0)
Neutro Abs: 9.6 10*3/uL — ABNORMAL HIGH (ref 1.4–7.7)
Platelets: 362 10*3/uL (ref 150.0–400.0)
RBC: 4.38 Mil/uL (ref 3.87–5.11)
RDW: 13.3 % (ref 11.5–15.5)
WBC: 11.9 10*3/uL — ABNORMAL HIGH (ref 4.0–10.5)

## 2015-04-27 LAB — BASIC METABOLIC PANEL
BUN: 6 mg/dL (ref 6–23)
CALCIUM: 9.2 mg/dL (ref 8.4–10.5)
CO2: 30 meq/L (ref 19–32)
Chloride: 101 mEq/L (ref 96–112)
Creatinine, Ser: 0.58 mg/dL (ref 0.40–1.20)
GFR: 119.63 mL/min (ref >60.00)
Glucose, Bld: 109 mg/dL — ABNORMAL HIGH (ref 70–99)
Potassium: 3.6 mEq/L (ref 3.5–5.1)
SODIUM: 138 meq/L (ref 135–145)

## 2015-04-27 MED ORDER — LEVOFLOXACIN 500 MG PO TABS: 500.0000 mg | ORAL_TABLET | Freq: Every day | ORAL | Status: DC

## 2015-04-27 NOTE — Patient Instructions (Signed)
For now, we will hold on further antibiotic  Please continue all other medications as before, and refills have been done if requested.  Please have the pharmacy call with any other refills you may need.  Please keep your appointments with your specialists as you may have planned  You will be contacted regarding the referral for: renal ultrasound - to rule out abscess  Please go to the LAB in the Basement (turn left off the elevator) for the tests to be done today  You will be contacted by phone if any changes need to be made immediately.  Otherwise, you will receive a letter about your results with an explanation, but please check with MyChart first.  Please remember to sign up for MyChart if you have not done so, as this will be important to you in the future with finding out test results, communicating by private email, and scheduling acute appointments online when needed.

## 2015-04-27 NOTE — Progress Notes (Signed)
Pre visit review using our clinic review tool, if applicable. No additional management support is needed unless otherwise documented below in the visit note. 

## 2015-04-27 NOTE — Assessment & Plan Note (Signed)
Mild persistent, o/w not worsening recent, cont same tx, does not appear to needed counseling,  to f/u any worsening symptoms or concerns

## 2015-04-27 NOTE — Assessment & Plan Note (Signed)
stable overall by history and exam, recent data reviewed with pt, and pt to continue medical treatment as before,  to f/u any worsening symptoms or concerns Lab Results  Component Value Date   HGBA1C 5.8 04/09/2007   Pt to call for polys or cbg > 200 with illness

## 2015-04-27 NOTE — Progress Notes (Signed)
Subjective:    Patient ID: [name], female    DOB: 01-Oct-1970, 45 y.o.   MRN: [id number]  HPI  Here to f/u recent UTI, was tx with macrobid course x 2 recently per GYN for UTI after udip abnormal, now off antibx for 2 days, and per pt she believes cultures were done and the antibx was supposed to be appropriate.  Overall feels improved as she initially had high temp 102, bilat flank pain and dysuria.  Unfortunately still with fever and weakness, though admits does not quite feel as bad as start of tx.  Denies urinary symptoms such as dysuria, frequency, urgency, flank pain, hematuria or n/v, chills, but states temp earlier today at home 101, and here is low grade 99.1.  Has not taken tylenol or advil this am.  Pt denies chest pain, increased sob or doe, wheezing, orthopnea, PND, increased LE swelling, palpitations, dizziness or syncope. No other unusual symptoms such as HA, sinus pain, ST, cough, or worsening abd pain or back pain of any type.  Does have some mild sinus congestion, but believes this to be allergies,  No sick contacts Past Medical History  Diagnosis Date  . Impaired glucose tolerance   . HYPERLIPIDEMIA   . GERD   . ANXIETY   . ALLERGIC RHINITIS   . Tachycardia   . Gestational diabetes    Past Surgical History  Procedure Laterality Date  . Tonsillectomy and adenoidectomy    . Cesarean section  2003/2008    x 2  . Nasal sinus surgery  1993    reports that she has never smoked. She has never used smokeless tobacco. She reports that she does not drink alcohol or use illicit drugs. family history includes Breast cancer (age of onset: 56) in her mother; Colon polyps in her father and mother; Diabetes in her father; Heart disease in her father; Liver disease in her father; Parkinson's disease in her mother. Allergies  Allergen Reactions  . Ciprofloxacin   . Sulfonamide Derivatives    Current Outpatient Prescriptions on File Prior to Visit  Medication Sig Dispense  Refill  . fluconazole (DIFLUCAN) 150 MG tablet Take 1 tablet (150 mg total) by mouth once. 1 tablet 0  . hydrocortisone-pramoxine (ANALPRAM-HC) 2.5-1 % rectal cream Place 1 application rectally daily. For itching, pain 30 g 5  . metoprolol tartrate (LOPRESSOR) 25 MG tablet Take 1 tablet (25 mg total) by mouth daily. 90 tablet 3  . triamcinolone cream (KENALOG) 0.1 % Apply 1 application topically 2 (two) times daily. 30 g 0   No current facility-administered medications on file prior to visit.   Review of Systems  Constitutional: Negative for unusual diaphoresis or night sweats HENT: Negative for ear swelling or discharge Eyes: Negative for worsening visual haziness  Respiratory: Negative for choking and stridor.   Gastrointestinal: Negative for distension or worsening eructation Genitourinary: Negative for retention or change in urine volume.  Musculoskeletal: Negative for other MSK pain or swelling Skin: Negative for color change and worsening wound Neurological: Negative for tremors and numbness other than noted  Psychiatric/Behavioral: Negative for decreased concentration or agitation other than above       Objective:   Physical Exam BP 124/76 mmHg  Pulse 110  Temp(Src) 99.1 F (37.3 C) (Oral)  Resp 20  Wt 168 lb (76.204 kg)  SpO2 96% VS noted, fatigued Constitutional: Pt appears in no apparent distress HENT: Head: NCAT.  Right Ear: External ear normal.  Left Ear: External ear normal.  Eyes: . Pupils are equal, round, and reactive to light. Conjunctivae and EOM are normal Neck: Normal range of motion. Neck supple.  Cardiovascular: Normal rate and regular rhythm.   Pulmonary/Chest: Effort normal and breath sounds without rales or wheezing.  Abd:  Soft, NT, ND, + BS - benign, no flank tender Neurological: Pt is alert. Not confused , motor grossly intact Skin: Skin is warm. No rash, no LE edema Psychiatric: Pt behavior is normal. No agitation. mild nervous   POCT  Urinalysis Dipstick  Status: Finalresult Visible to patient:  Not Released Dx:  Dysuria   Normal           Ref Range 11:20 AM  15yr ago  45yr ago  68yr ago     Color, UA  yellow       Clarity, UA  cloudy       Glucose, UA  negative       Bilirubin, UA  negative       Ketones, UA  negative       Spec Grav, UA  1.020       Blood, UA  negative       pH, UA  7.5       Protein, UA  negative       Urobilinogen, UA  negative 0.2 0.2 0.2    Nitrite, UA  negative       Leukocytes, UA Negative  Negative               Assessment & Plan:

## 2015-04-27 NOTE — Assessment & Plan Note (Signed)
Clinically improved but still with fever, see above

## 2015-04-27 NOTE — Assessment & Plan Note (Signed)
Etiology unclear, documented low grade , but higher per pt prior to presentation this am, Udip neg, overall GU symptoms resolved, exam benign, but given fever, fatigue and to help allay pt concern about persistent infection, will check renal u/s - r/o abscess, also for f/u urine cx, cbc, bmp, and consider antibx for abnormal resuilts.

## 2015-04-28 ENCOUNTER — Telehealth: Payer: Self-pay | Admitting: Internal Medicine

## 2015-04-28 NOTE — Telephone Encounter (Signed)
Called and spoke to patient she now understands lab results

## 2015-04-28 NOTE — Telephone Encounter (Signed)
Pt called in and would like someone to call her about the results.  She does not understand the my chart message   Best number [phone]

## 2015-04-29 LAB — URINE CULTURE

## 2015-05-02 ENCOUNTER — Encounter: Payer: BC Managed Care – PPO | Admitting: Internal Medicine

## 2015-05-04 LAB — CULTURE, BLOOD (SINGLE): ORGANISM ID, BACTERIA: NO GROWTH

## 2015-05-16 ENCOUNTER — Telehealth: Payer: Self-pay | Admitting: Internal Medicine

## 2015-05-16 NOTE — Telephone Encounter (Signed)
Please advise 

## 2015-05-16 NOTE — Telephone Encounter (Signed)
Was the flu documented by testing?  Was he treated with tamiflu?  If so, then i would be ok with tamiflu for her

## 2015-05-16 NOTE — Telephone Encounter (Signed)
Patient Name: [name]  DOB: 29-Nov-1970    Initial Comment Caller states son has the flu, asking if she should get Tamiflu   Nurse Assessment  Nurse: Raphael Gibney, RN, Vanita Ingles Date/Time (Eastern Time): 05/16/2015 1:07:06 PM  Confirm and document reason for call. If symptomatic, describe symptoms. You must click the next button to save text entered. ---Caller states her son has the flu. No symptoms yet. he went to the doctor this am and was diagnosed with flu. Wants to know about taking the tamiflu.  Has the patient traveled out of the country within the last 30 days? ---Not Applicable  Does the patient have any new or worsening symptoms? ---Yes  Will a triage be completed? ---Yes  Related visit to physician within the last 2 weeks? ---No  Does the PT have any chronic conditions? (i.e. diabetes, asthma, etc.) ---No  Is the patient pregnant or possibly pregnant? (Ask all females between the ages of 6-55) ---No  Is this a behavioral health or substance abuse call? ---No     Guidelines    Guideline Title Affirmed Question Affirmed Notes  Influenza Exposure [1]Influenza EXPOSURE (Close Contact) within last 7 days AND [2] NO respiratory symptoms (all triage questions negative)    Final Disposition User   Mylo, RN, Vanita Ingles    Comments  Pt wants to know if she needs Tamiflu. Please call pt back regarding tamiflu.   Disagree/Comply: Comply

## 2015-07-11 ENCOUNTER — Other Ambulatory Visit (INDEPENDENT_AMBULATORY_CARE_PROVIDER_SITE_OTHER): Payer: BC Managed Care – PPO

## 2015-07-11 DIAGNOSIS — Z0189 Encounter for other specified special examinations: Secondary | ICD-10-CM | POA: Diagnosis not present

## 2015-07-11 DIAGNOSIS — Z0000 Encounter for general adult medical examination without abnormal findings: Secondary | ICD-10-CM

## 2015-07-11 LAB — CBC WITH DIFFERENTIAL/PLATELET
BASOS ABS: 0.1 10*3/uL (ref 0.0–0.1)
Basophils Relative: 0.8 % (ref 0.0–3.0)
EOS PCT: 11.2 % — AB (ref 0.0–5.0)
Eosinophils Absolute: 1 10*3/uL — ABNORMAL HIGH (ref 0.0–0.7)
HEMATOCRIT: 37 % (ref 36.0–46.0)
Hemoglobin: 12.6 g/dL (ref 12.0–15.0)
LYMPHS PCT: 18.8 % (ref 12.0–46.0)
Lymphs Abs: 1.7 10*3/uL (ref 0.7–4.0)
MCHC: 34.2 g/dL (ref 30.0–36.0)
MCV: 85.2 fl (ref 78.0–100.0)
MONOS PCT: 7.4 % (ref 3.0–12.0)
Monocytes Absolute: 0.7 10*3/uL (ref 0.1–1.0)
Neutro Abs: 5.6 10*3/uL (ref 1.4–7.7)
Neutrophils Relative %: 61.8 % (ref 43.0–77.0)
Platelets: 307 10*3/uL (ref 150.0–400.0)
RBC: 4.34 Mil/uL (ref 3.87–5.11)
RDW: 14.2 % (ref 11.5–15.5)
WBC: 9.1 10*3/uL (ref 4.0–10.5)

## 2015-07-11 LAB — BASIC METABOLIC PANEL
BUN: 8 mg/dL (ref 6–23)
CALCIUM: 9 mg/dL (ref 8.4–10.5)
CO2: 28 meq/L (ref 19–32)
CREATININE: 0.54 mg/dL (ref 0.40–1.20)
Chloride: 104 mEq/L (ref 96–112)
GFR: 129.8 mL/min (ref >60.00)
GLUCOSE: 115 mg/dL — AB (ref 70–99)
Potassium: 3.9 mEq/L (ref 3.5–5.1)
Sodium: 139 mEq/L (ref 135–145)

## 2015-07-11 LAB — LIPID PANEL
Cholesterol: 173 mg/dL (ref 0–200)
HDL: 48.1 mg/dL (ref >39.00)
LDL CALC: 112 mg/dL — AB (ref 0–99)
NONHDL: 124.68
Total CHOL/HDL Ratio: 4
Triglycerides: 61 mg/dL (ref 0.0–149.0)
VLDL: 12.2 mg/dL (ref 0.0–40.0)

## 2015-07-11 LAB — URINALYSIS, ROUTINE W REFLEX MICROSCOPIC
BILIRUBIN URINE: NEGATIVE
HGB URINE DIPSTICK: NEGATIVE
KETONES UR: NEGATIVE
NITRITE: NEGATIVE
PH: 7 (ref 5.0–8.0)
RBC / HPF: NONE SEEN (ref 0–?)
Specific Gravity, Urine: 1.015 (ref 1.000–1.030)
TOTAL PROTEIN, URINE-UPE24: NEGATIVE
UROBILINOGEN UA: 0.2 (ref 0.0–1.0)
Urine Glucose: NEGATIVE

## 2015-07-11 LAB — HEPATIC FUNCTION PANEL
ALBUMIN: 3.8 g/dL (ref 3.5–5.2)
ALT: 25 U/L (ref 0–35)
AST: 21 U/L (ref 0–37)
Alkaline Phosphatase: 50 U/L (ref 39–117)
BILIRUBIN DIRECT: 0.4 mg/dL — AB (ref 0.0–0.3)
TOTAL PROTEIN: 6.8 g/dL (ref 6.0–8.3)
Total Bilirubin: 0.3 mg/dL (ref 0.2–1.2)

## 2015-07-11 LAB — TSH: TSH: 2.29 u[IU]/mL (ref 0.35–4.50)

## 2015-07-12 ENCOUNTER — Encounter: Payer: Self-pay | Admitting: Internal Medicine

## 2015-07-12 ENCOUNTER — Other Ambulatory Visit (INDEPENDENT_AMBULATORY_CARE_PROVIDER_SITE_OTHER): Payer: BC Managed Care – PPO

## 2015-07-12 ENCOUNTER — Ambulatory Visit (INDEPENDENT_AMBULATORY_CARE_PROVIDER_SITE_OTHER): Payer: BC Managed Care – PPO | Admitting: Internal Medicine

## 2015-07-12 VITALS — BP 120/78 | HR 89 | Temp 98.5°F | Resp 20 | Wt 168.0 lb

## 2015-07-12 DIAGNOSIS — Z0001 Encounter for general adult medical examination with abnormal findings: Secondary | ICD-10-CM

## 2015-07-12 DIAGNOSIS — F411 Generalized anxiety disorder: Secondary | ICD-10-CM | POA: Diagnosis not present

## 2015-07-12 DIAGNOSIS — R7302 Impaired glucose tolerance (oral): Secondary | ICD-10-CM

## 2015-07-12 DIAGNOSIS — E669 Obesity, unspecified: Secondary | ICD-10-CM

## 2015-07-12 DIAGNOSIS — J069 Acute upper respiratory infection, unspecified: Secondary | ICD-10-CM | POA: Diagnosis not present

## 2015-07-12 DIAGNOSIS — I10 Essential (primary) hypertension: Secondary | ICD-10-CM

## 2015-07-12 DIAGNOSIS — R6889 Other general symptoms and signs: Secondary | ICD-10-CM

## 2015-07-12 LAB — HEMOGLOBIN A1C: Hgb A1c MFr Bld: 5.9 % (ref 4.6–6.5)

## 2015-07-12 MED ORDER — HYDROCODONE-HOMATROPINE 5-1.5 MG/5ML PO SYRP: 5.0000 mL | ORAL_SOLUTION | Freq: Four times a day (QID) | ORAL | Status: DC | PRN

## 2015-07-12 NOTE — Patient Instructions (Signed)
Please take all new medication as prescribed - the cough medicine  Please continue all other medications as before, and refills have been done if requested.  Please have the pharmacy call with any other refills you may need.  Please continue your efforts at being more active, low cholesterol diet, and weight control.  You are otherwise up to date with prevention measures today.  Please keep your appointments with your specialists as you may have planned  We will try to addon a Lab test called A1c and hopefully you will be notified of result soon  You will be contacted by phone if any changes need to be made immediately.  Otherwise, you will receive a letter about your results with an explanation, but please check with MyChart first.  Please remember to sign up for MyChart if you have not done so, as this will be important to you in the future with finding out test results, communicating by private email, and scheduling acute appointments online when needed.  Please return in 1 year for your yearly visit, or sooner if needed, with Lab testing done 3-5 days before

## 2015-07-12 NOTE — Progress Notes (Signed)
Pre visit review using our clinic review tool, if applicable. No additional management support is needed unless otherwise documented below in the visit note. 

## 2015-07-12 NOTE — Assessment & Plan Note (Signed)
Asympt, for a1c addon to lab,  to f/u any worsening symptoms or concerns

## 2015-07-12 NOTE — Assessment & Plan Note (Addendum)
C/w likely viral, for mucinex and cough med prn,  to f/u any worsening symptoms or concerns  In addition to the time spent performing CPE, I spent an additional 15 minutes face to face,in which greater than 50% of this time was spent in counseling and coordination of care for patient's illness as documented.

## 2015-07-12 NOTE — Progress Notes (Signed)
Subjective:    Patient ID: [name], female    DOB: 29-Dec-1970, 45 y.o.   MRN: [id number]  HPI  Here for wellness and f/u;  Overall doing ok;  Pt denies Chest pain, worsening SOB, DOE, wheezing, orthopnea, PND, worsening LE edema, palpitations, dizziness or syncope.  Pt denies neurological change such as new headache, facial or extremity weakness.  Pt denies polydipsia, polyuria, or low sugar symptoms. Pt states overall good compliance with treatment and medications, good tolerability, and has been trying to follow appropriate diet.  Pt denies worsening depressive symptoms, suicidal ideation or panic. No fever, night sweats, wt loss, loss of appetite, or other constitutional symptoms.  Pt states good ability with ADL's, has low fall risk, home safety reviewed and adequate, no other significant changes in hearing or vision, and only occasionally active with exercise. Wt Readings from Last 3 Encounters:  07/12/15 168 lb (76.204 kg)  04/27/15 168 lb (76.204 kg)  03/02/15 167 lb (75.751 kg)    Here with 2-3 days acute onset fever, facial pain, pressure, headache, general weakness and malaise, and greenish d/c, with mild ST and cough.  Past Medical History  Diagnosis Date  . Impaired glucose tolerance   . HYPERLIPIDEMIA   . GERD   . ANXIETY   . ALLERGIC RHINITIS   . Tachycardia   . Gestational diabetes    Past Surgical History  Procedure Laterality Date  . Tonsillectomy and adenoidectomy    . Cesarean section  2003/2008    x 2  . Nasal sinus surgery  1993    reports that she has never smoked. She has never used smokeless tobacco. She reports that she does not drink alcohol or use illicit drugs. family history includes Breast cancer (age of onset: 60) in her mother; Colon polyps in her father and mother; Diabetes in her father; Heart disease in her father; Liver disease in her father; Parkinson's disease in her mother. Allergies  Allergen Reactions  . Ciprofloxacin   . Sulfonamide  Derivatives    Current Outpatient Prescriptions on File Prior to Visit  Medication Sig Dispense Refill  . metoprolol tartrate (LOPRESSOR) 25 MG tablet Take 1 tablet (25 mg total) by mouth daily. 90 tablet 3   No current facility-administered medications on file prior to visit.     Review of Systems Constitutional: Negative for increased diaphoresis, or other activity, appetite or siginficant weight change other than noted HENT: Negative for worsening hearing loss, ear pain, facial swelling, mouth sores and neck stiffness.   Eyes: Negative for other worsening pain, redness or visual disturbance.  Respiratory: Negative for choking or stridor Cardiovascular: Negative for other chest pain and palpitations.  Gastrointestinal: Negative for worsening diarrhea, blood in stool, or abdominal distention Genitourinary: Negative for hematuria, flank pain or change in urine volume.  Musculoskeletal: Negative for myalgias or other joint complaints.  Skin: Negative for other color change and wound or drainage.  Neurological: Negative for syncope and numbness. other than noted Hematological: Negative for adenopathy. or other swelling Psychiatric/Behavioral: Negative for hallucinations, SI, self-injury, decreased concentration or other worsening agitation.      Objective:   Physical Exam BP 120/78 mmHg  Pulse 89  Temp(Src) 98.5 F (36.9 C) (Oral)  Resp 20  Wt 168 lb (76.204 kg)  SpO2 93% VS noted, mild ill Constitutional: Pt is oriented to person, place, and time. Appears well-developed and well-nourished, in no significant distress Head: Normocephalic and atraumatic  Eyes: Conjunctivae and EOM are normal. Pupils are  equal, round, and reactive to light Right Ear: External ear normal.  Left Ear: External ear normal Nose: Nose normal.  Mouth/Throat: Oropharynx is clear and moist  Bilat tm's with mild erythema.  Max sinus areas mild tender.  Pharynx with mild erythema, no exudate Neck: Normal  range of motion. Neck supple. No JVD present. No tracheal deviation present or significant neck LA or mass Cardiovascular: Normal rate, regular rhythm, normal heart sounds and intact distal pulses.   Pulmonary/Chest: Effort normal and breath sounds without rales or wheezing  Abdominal: Soft. Bowel sounds are normal. NT. No HSM  Musculoskeletal: Normal range of motion. Exhibits no edema Lymphadenopathy: Has no cervical adenopathy.  Neurological: Pt is alert and oriented to person, place, and time. Pt has normal reflexes. No cranial nerve deficit. Motor grossly intact Skin: Skin is warm and dry. No rash noted or new ulcers Psychiatric:  Has mld nervous mood and affect. Behavior is normal.     Assessment & Plan:

## 2015-07-16 NOTE — Assessment & Plan Note (Signed)
stable overall by history and exam, recent data reviewed with pt, and pt to continue medical treatment as before,  to f/u any worsening symptoms or concerns BP Readings from Last 3 Encounters:  07/12/15 120/78  04/27/15 124/76  03/02/15 126/72

## 2015-07-16 NOTE — Assessment & Plan Note (Signed)

## 2015-07-16 NOTE — Assessment & Plan Note (Signed)
stable overall by history and exam, recent data reviewed with pt, and pt to continue medical treatment as before,  to f/u any worsening symptoms or concerns Lab Results  Component Value Date   WBC 9.1 07/11/2015   HGB 12.6 07/11/2015   HCT 37.0 07/11/2015   PLT 307.0 07/11/2015   GLUCOSE 115* 07/11/2015   CHOL 173 07/11/2015   TRIG 61.0 07/11/2015   HDL 48.10 07/11/2015   LDLDIRECT 142.5 09/01/2006   LDLCALC 112* 07/11/2015   ALT 25 07/11/2015   AST 21 07/11/2015   NA 139 07/11/2015   K 3.9 07/11/2015   CL 104 07/11/2015   CREATININE 0.54 07/11/2015   BUN 8 07/11/2015   CO2 28 07/11/2015   TSH 2.29 07/11/2015   HGBA1C 5.9 07/12/2015

## 2015-07-16 NOTE — Assessment & Plan Note (Signed)
Plans to be more active, less calories, wt loss

## 2015-11-08 ENCOUNTER — Other Ambulatory Visit: Payer: Self-pay | Admitting: Obstetrics & Gynecology

## 2015-11-08 DIAGNOSIS — Z1231 Encounter for screening mammogram for malignant neoplasm of breast: Secondary | ICD-10-CM

## 2016-01-16 ENCOUNTER — Ambulatory Visit
Admission: RE | Admit: 2016-01-16 | Discharge: 2016-01-16 | Disposition: A | Discharge status: Home / Self Care | Payer: BC Managed Care – PPO | Source: Ambulatory Visit | Attending: Obstetrics & Gynecology | Admitting: Obstetrics & Gynecology

## 2016-01-16 DIAGNOSIS — Z1231 Encounter for screening mammogram for malignant neoplasm of breast: Secondary | ICD-10-CM

## 2016-04-05 ENCOUNTER — Other Ambulatory Visit: Payer: Self-pay | Admitting: Internal Medicine

## 2016-04-05 MED ORDER — METOPROLOL TARTRATE 25 MG PO TABS: ORAL_TABLET | ORAL | 0 refills | Status: DC

## 2016-04-05 NOTE — Telephone Encounter (Signed)
Rec'd call pt states a 30 day script was sent to her pharmacy on her metoprolol, and was told to make appt. Pt states she is not due back in until June for her yearly physical. Verified chart inform pt can resend script for 90 day, and keep CPX appt in June for future refills...[name]

## 2016-04-05 NOTE — Telephone Encounter (Signed)
Requested Prescriptions   Pending Prescriptions Disp Refills  . metoprolol tartrate (LOPRESSOR) 25 MG tablet [Pharmacy Med Name: METOPROLOL TARTRATE 25MG  TABLETS] 90 tablet 0    Sig: TAKE 1 TABLET(25 MG) BY MOUTH DAILY    Last OV 07/12/2015.  30 day supply sent it. Patient was informed via [facility].

## 2016-04-05 NOTE — Addendum Note (Signed)
Addended by: Earnstine Regal on: 04/05/2016 10:01 AM   Modules accepted: Orders

## 2016-05-05 ENCOUNTER — Other Ambulatory Visit: Payer: Self-pay | Admitting: Internal Medicine

## 2016-07-08 ENCOUNTER — Other Ambulatory Visit (INDEPENDENT_AMBULATORY_CARE_PROVIDER_SITE_OTHER): Payer: BC Managed Care – PPO

## 2016-07-08 DIAGNOSIS — Z0001 Encounter for general adult medical examination with abnormal findings: Secondary | ICD-10-CM | POA: Diagnosis not present

## 2016-07-08 LAB — CBC WITH DIFFERENTIAL/PLATELET
BASOS ABS: 0.1 10*3/uL (ref 0.0–0.1)
Basophils Relative: 1 % (ref 0.0–3.0)
EOS ABS: 0.9 10*3/uL — AB (ref 0.0–0.7)
Eosinophils Relative: 8.8 % — ABNORMAL HIGH (ref 0.0–5.0)
HCT: 38.1 % (ref 36.0–46.0)
Hemoglobin: 13.1 g/dL (ref 12.0–15.0)
LYMPHS ABS: 1.9 10*3/uL (ref 0.7–4.0)
Lymphocytes Relative: 19.7 % (ref 12.0–46.0)
MCHC: 34.3 g/dL (ref 30.0–36.0)
MCV: 87.4 fl (ref 78.0–100.0)
MONO ABS: 0.6 10*3/uL (ref 0.1–1.0)
MONOS PCT: 6.5 % (ref 3.0–12.0)
NEUTROS ABS: 6.2 10*3/uL (ref 1.4–7.7)
NEUTROS PCT: 64 % (ref 43.0–77.0)
Platelets: 293 10*3/uL (ref 150.0–400.0)
RBC: 4.35 Mil/uL (ref 3.87–5.11)
RDW: 13.1 % (ref 11.5–15.5)
WBC: 9.7 10*3/uL (ref 4.0–10.5)

## 2016-07-08 LAB — URINALYSIS, ROUTINE W REFLEX MICROSCOPIC
BILIRUBIN URINE: NEGATIVE
HGB URINE DIPSTICK: NEGATIVE
KETONES UR: NEGATIVE
LEUKOCYTES UA: NEGATIVE
NITRITE: NEGATIVE
RBC / HPF: NONE SEEN (ref 0–?)
Specific Gravity, Urine: 1.015 (ref 1.000–1.030)
TOTAL PROTEIN, URINE-UPE24: NEGATIVE
URINE GLUCOSE: NEGATIVE
Urobilinogen, UA: 0.2 (ref 0.0–1.0)
WBC, UA: NONE SEEN (ref 0–?)
pH: 6.5 (ref 5.0–8.0)

## 2016-07-08 LAB — HEPATIC FUNCTION PANEL
ALK PHOS: 48 U/L (ref 39–117)
ALT: 12 U/L (ref 0–35)
AST: 14 U/L (ref 0–37)
Albumin: 3.9 g/dL (ref 3.5–5.2)
BILIRUBIN DIRECT: 0.1 mg/dL (ref 0.0–0.3)
TOTAL PROTEIN: 6.5 g/dL (ref 6.0–8.3)
Total Bilirubin: 0.4 mg/dL (ref 0.2–1.2)

## 2016-07-08 LAB — LIPID PANEL
Cholesterol: 168 mg/dL (ref 0–200)
HDL: 46.1 mg/dL (ref >39.00)
LDL Cholesterol: 110 mg/dL — ABNORMAL HIGH (ref 0–99)
NONHDL: 121.52
Total CHOL/HDL Ratio: 4
Triglycerides: 58 mg/dL (ref 0.0–149.0)
VLDL: 11.6 mg/dL (ref 0.0–40.0)

## 2016-07-08 LAB — BASIC METABOLIC PANEL
BUN: 7 mg/dL (ref 6–23)
CHLORIDE: 104 meq/L (ref 96–112)
CO2: 28 meq/L (ref 19–32)
Calcium: 9.1 mg/dL (ref 8.4–10.5)
Creatinine, Ser: 0.61 mg/dL (ref 0.40–1.20)
GFR: 112.27 mL/min (ref >60.00)
Glucose, Bld: 117 mg/dL — ABNORMAL HIGH (ref 70–99)
Potassium: 3.5 mEq/L (ref 3.5–5.1)
SODIUM: 138 meq/L (ref 135–145)

## 2016-07-08 LAB — TSH: TSH: 2.57 u[IU]/mL (ref 0.35–4.50)

## 2016-07-12 ENCOUNTER — Ambulatory Visit (INDEPENDENT_AMBULATORY_CARE_PROVIDER_SITE_OTHER): Payer: BC Managed Care – PPO | Admitting: Internal Medicine

## 2016-07-12 ENCOUNTER — Encounter: Payer: Self-pay | Admitting: Internal Medicine

## 2016-07-12 VITALS — BP 126/84 | HR 75 | Ht 60.0 in | Wt 183.0 lb

## 2016-07-12 DIAGNOSIS — Z114 Encounter for screening for human immunodeficiency virus [HIV]: Secondary | ICD-10-CM

## 2016-07-12 DIAGNOSIS — E785 Hyperlipidemia, unspecified: Secondary | ICD-10-CM | POA: Diagnosis not present

## 2016-07-12 DIAGNOSIS — R7302 Impaired glucose tolerance (oral): Secondary | ICD-10-CM

## 2016-07-12 DIAGNOSIS — Z0000 Encounter for general adult medical examination without abnormal findings: Secondary | ICD-10-CM | POA: Diagnosis not present

## 2016-07-12 MED ORDER — METOPROLOL TARTRATE 25 MG PO TABS: 25.0000 mg | ORAL_TABLET | Freq: Every day | ORAL | 3 refills | Status: DC

## 2016-07-12 NOTE — Assessment & Plan Note (Signed)
Goal < 100, for lower chol diet

## 2016-07-12 NOTE — Progress Notes (Signed)
Subjective:    Patient ID: [name], female    DOB: 09-26-1970, 46 y.o.   MRN: [id number]  HPI  Here for wellness and f/u;  Overall doing ok;  Pt denies Chest pain, worsening SOB, DOE, wheezing, orthopnea, PND, worsening LE edema, palpitations, dizziness or syncope.  Pt denies neurological change such as new headache, facial or extremity weakness.  Pt denies polydipsia, polyuria, or low sugar symptoms. Pt states overall good compliance with treatment and medications, good tolerability, and has been trying to follow appropriate diet.  Pt denies worsening depressive symptoms, suicidal ideation or panic. No fever, night sweats, wt loss, loss of appetite, or other constitutional symptoms.  Pt states good ability with ADL's, has low fall risk, home safety reviewed and adequate, no other significant changes in hearing or vision, and not active with exercise, but plans to start walking more soon BP Readings from Last 3 Encounters:  07/12/16 126/84  07/12/15 120/78  04/27/15 124/76   Wt Readings from Last 3 Encounters:  07/12/16 183 lb (83 kg)  07/12/15 168 lb (76.2 kg)  04/27/15 168 lb (76.2 kg)   Past Medical History:  Diagnosis Date  . ALLERGIC RHINITIS   . ANXIETY   . GERD   . Gestational diabetes   . HYPERLIPIDEMIA   . Impaired glucose tolerance   . Tachycardia    Past Surgical History:  Procedure Laterality Date  . CESAREAN SECTION  2003/2008   x 2  . NASAL SINUS SURGERY  1993  . TONSILLECTOMY AND ADENOIDECTOMY      reports that she has never smoked. She has never used smokeless tobacco. She reports that she does not drink alcohol or use drugs. family history includes Breast cancer (age of onset: 40) in her mother; Colon polyps in her father and mother; Diabetes in her father; Heart disease in her father; Liver disease in her father; Parkinson's disease in her mother. Allergies  Allergen Reactions  . Ciprofloxacin   . Sulfonamide Derivatives    Current Outpatient  Prescriptions on File Prior to Visit  Medication Sig Dispense Refill  . metoprolol tartrate (LOPRESSOR) 25 MG tablet Take 1 tablet (25 mg total) by mouth daily. 30 tablet 1   No current facility-administered medications on file prior to visit.      Review of Systems Constitutional: Negative for other unusual diaphoresis, sweats, appetite or weight changes HENT: Negative for other worsening hearing loss, ear pain, facial swelling, mouth sores or neck stiffness.   Eyes: Negative for other worsening pain, redness or other visual disturbance.  Respiratory: Negative for other stridor or swelling Cardiovascular: Negative for other palpitations or other chest pain  Gastrointestinal: Negative for worsening diarrhea or loose stools, blood in stool, distention or other pain Genitourinary: Negative for hematuria, flank pain or other change in urine volume.  Musculoskeletal: Negative for myalgias or other joint swelling.  Skin: Negative for other color change, or other wound or worsening drainage.  Neurological: Negative for other syncope or numbness. Hematological: Negative for other adenopathy or swelling Psychiatric/Behavioral: Negative for hallucinations, other worsening agitation, SI, self-injury, or new decreased concentration All other system neg per pt    Objective:   Physical Exam BP 126/84   Pulse 75   Ht 5' (1.524 m)   Wt 183 lb (83 kg)   SpO2 98%   BMI 35.74 kg/m  VS noted, obese Constitutional: Pt is oriented to person, place, and time. Appears well-developed and well-nourished, in no significant distress and comfortable Head: Normocephalic  and atraumatic  Eyes: Conjunctivae and EOM are normal. Pupils are equal, round, and reactive to light Right Ear: External ear normal without discharge Left Ear: External ear normal without discharge Nose: Nose without discharge or deformity Mouth/Throat: Oropharynx is without other ulcerations and moist  Neck: Normal range of motion. Neck  supple. No JVD present. No tracheal deviation present or significant neck LA or mass Cardiovascular: Normal rate, regular rhythm, normal heart sounds and intact distal pulses.   Pulmonary/Chest: WOB normal and breath sounds without rales or wheezing  Abdominal: Soft. Bowel sounds are normal. NT. No HSM  Musculoskeletal: Normal range of motion. Exhibits no edema Lymphadenopathy: Has no other cervical adenopathy.  Neurological: Pt is alert and oriented to person, place, and time. Pt has normal reflexes. No cranial nerve deficit. Motor grossly intact, Gait intact Skin: Skin is warm and dry. No rash noted or new ulcerations Psychiatric:  Has normal mood and affect. Behavior is normal without agitation No other exam findings Lab Results  Component Value Date   WBC 9.7 07/08/2016   HGB 13.1 07/08/2016   HCT 38.1 07/08/2016   PLT 293.0 07/08/2016   GLUCOSE 117 (H) 07/08/2016   CHOL 168 07/08/2016   TRIG 58.0 07/08/2016   HDL 46.10 07/08/2016   LDLDIRECT 142.5 09/01/2006   LDLCALC 110 (H) 07/08/2016   ALT 12 07/08/2016   AST 14 07/08/2016   NA 138 07/08/2016   K 3.5 07/08/2016   CL 104 07/08/2016   CREATININE 0.61 07/08/2016   BUN 7 07/08/2016   CO2 28 07/08/2016   TSH 2.57 07/08/2016   HGBA1C 5.9 07/12/2015       Assessment & Plan:

## 2016-07-12 NOTE — Assessment & Plan Note (Signed)

## 2016-07-12 NOTE — Assessment & Plan Note (Signed)
Mild, for a1c with next labs, to work on diet, exercise, wt loss

## 2016-07-12 NOTE — Patient Instructions (Signed)
Please continue all other medications as before, and refills have been done if requested.  Please have the pharmacy call with any other refills you may need.  Please continue your efforts at being more active, low cholesterol diet, and weight control.  You are otherwise up to date with prevention measures today.  Please keep your appointments with your specialists as you may have planned  Please return in 1 year for your yearly visit, or sooner if needed, with Lab testing done 3-5 days before  

## 2016-09-09 ENCOUNTER — Ambulatory Visit (INDEPENDENT_AMBULATORY_CARE_PROVIDER_SITE_OTHER): Payer: BC Managed Care – PPO | Admitting: Obstetrics & Gynecology

## 2016-09-09 ENCOUNTER — Encounter: Payer: Self-pay | Admitting: Obstetrics & Gynecology

## 2016-09-09 VITALS — BP 126/82 | Ht 59.75 in | Wt 185.0 lb

## 2016-09-09 DIAGNOSIS — Z01419 Encounter for gynecological examination (general) (routine) without abnormal findings: Secondary | ICD-10-CM

## 2016-09-09 DIAGNOSIS — Z9851 Tubal ligation status: Secondary | ICD-10-CM

## 2016-09-09 DIAGNOSIS — Z1151 Encounter for screening for human papillomavirus (HPV): Secondary | ICD-10-CM | POA: Diagnosis not present

## 2016-09-09 DIAGNOSIS — E6609 Other obesity due to excess calories: Secondary | ICD-10-CM | POA: Diagnosis not present

## 2016-09-09 DIAGNOSIS — Z6836 Body mass index (BMI) 36.0-36.9, adult: Secondary | ICD-10-CM | POA: Diagnosis not present

## 2016-09-09 NOTE — Progress Notes (Signed)
[name] [birth year] [id number]   History:    46 y.o. G2P2 Divorced. BT/S.  No boyfriend currently  RP:  Established patient presenting for annual gyn exam   HPI:  Menses reg normal every month (1 cycle exceptionally at 20 days).  No pelvic pain.  Normal vaginal secretions.  Breasts wnl.  Mictions/BMs wnl.  Past medical history,surgical history, family history and social history were all reviewed and documented in the EPIC chart.  Gynecologic History Patient's last menstrual period was 08/26/2016. Contraception: tubal ligation Last Pap: 2016. Results were: normal Last mammogram: 12/2015. Results were: normal  Obstetric History OB History  Gravida Para Term Preterm AB Living  2 2       2   SAB TAB Ectopic Multiple Live Births               # Outcome Date GA Lbr Len/2nd Weight Sex Delivery Anes PTL Lv  2 Para     F CS-Unspec     1 Para     M CS-Unspec          ROS: A ROS was performed and pertinent positives and negatives are included in the history.  GENERAL: No fevers or chills. HEENT: No change in vision, no earache, sore throat or sinus congestion. NECK: No pain or stiffness. CARDIOVASCULAR: No chest pain or pressure. No palpitations. PULMONARY: No shortness of breath, cough or wheeze. GASTROINTESTINAL: No abdominal pain, nausea, vomiting or diarrhea, melena or bright red blood per rectum. GENITOURINARY: No urinary frequency, urgency, hesitancy or dysuria. MUSCULOSKELETAL: No joint or muscle pain, no back pain, no recent trauma. DERMATOLOGIC: No rash, no itching, no lesions. ENDOCRINE: No polyuria, polydipsia, no heat or cold intolerance. No recent change in weight. HEMATOLOGICAL: No anemia or easy bruising or bleeding. NEUROLOGIC: No headache, seizures, numbness, tingling or weakness. PSYCHIATRIC: No depression, no loss of interest in normal activity or change in sleep pattern.     Exam:   BP 126/82   Ht 4' 11.75" (1.518 m)   Wt 185 lb (83.9 kg)   LMP 08/26/2016    BMI 36.43 kg/m   Body mass index is 36.43 kg/m.  General appearance : Well developed well nourished female. No acute distress HEENT: Eyes: no retinal hemorrhage or exudates,  Neck supple, trachea midline, no carotid bruits, no thyroidmegaly Lungs: Clear to auscultation, no rhonchi or wheezes, or rib retractions  Heart: Regular rate and rhythm, no murmurs or gallops Breast:Examined in sitting and supine position were symmetrical in appearance, no palpable masses or tenderness,  no skin retraction, no nipple inversion, no nipple discharge, no skin discoloration, no axillary or supraclavicular lymphadenopathy Abdomen: no palpable masses or tenderness, no rebound or guarding Extremities: no edema or skin discoloration or tenderness  Pelvic: Vulva normal  Bartholin, Urethra, Skene Glands: Within normal limits             Vagina: No gross lesions or discharge  Cervix: No gross lesions or discharge.  Pap/HPV done.  Uterus  AV, normal size, shape and consistency, non-tender and mobile  Adnexa  Without masses or tenderness  Anus and perineum  normal   Assessment/Plan:  46 y.o. female for annual exam   1. Encounter for routine gynecological examination with Papanicolaou smear of cervix Normal gyn exam.  Pap/HPV done.  Breasts wnl.  Screening Mammo neg 12/2015.  2. Tubal ligation status  3. Class 2 obesity due to excess calories with body mass index (BMI) of 36.0 to 36.9 in adult, unspecified  whether serious comorbidity present Low calorie/low carb diet discussed.  Regular physical activity recommended.  Princess Bruins MD, 4:16 PM 09/09/2016

## 2016-09-10 LAB — PAP, TP IMAGING W/ HPV RNA, RFLX HPV TYPE 16,18/45: HPV mRNA, High Risk: NOT DETECTED

## 2016-09-15 NOTE — Patient Instructions (Signed)
1. Encounter for routine gynecological examination with Papanicolaou smear of cervix Normal gyn exam.  Pap/HPV done.  Breasts wnl.  Screening Mammo neg 12/2015.  2. Tubal ligation status  3. Class 2 obesity due to excess calories with body mass index (BMI) of 36.0 to 36.9 in adult, unspecified whether serious comorbidity present Low calorie/low carb diet discussed.  Regular physical activity recommended.  [name], it was a pleasure to see you today!  I will inform you of your results as soon as available.  Health Maintenance, Female Adopting a healthy lifestyle and getting preventive care can go a long way to promote health and wellness. Talk with your health care provider about what schedule of regular examinations is right for you. This is a good chance for you to check in with your provider about disease prevention and staying healthy. In between checkups, there are plenty of things you can do on your own. Experts have done a lot of research about which lifestyle changes and preventive measures are most likely to keep you healthy. Ask your health care provider for more information. Weight and diet Eat a healthy diet  Be sure to include plenty of vegetables, fruits, low-fat dairy products, and lean protein.  Do not eat a lot of foods high in solid fats, added sugars, or salt.  Get regular exercise. This is one of the most important things you can do for your health. ? Most adults should exercise for at least 150 minutes each week. The exercise should increase your heart rate and make you sweat (moderate-intensity exercise). ? Most adults should also do strengthening exercises at least twice a week. This is in addition to the moderate-intensity exercise.  Maintain a healthy weight  Body mass index (BMI) is a measurement that can be used to identify possible weight problems. It estimates body fat based on height and weight. Your health care provider can help determine your BMI and help you  achieve or maintain a healthy weight.  For females 12 years of age and older: ? A BMI below 18.5 is considered underweight. ? A BMI of 18.5 to 24.9 is normal. ? A BMI of 25 to 29.9 is considered overweight. ? A BMI of 30 and above is considered obese.  Watch levels of cholesterol and blood lipids  You should start having your blood tested for lipids and cholesterol at 46 years of age, then have this test every 5 years.  You may need to have your cholesterol levels checked more often if: ? Your lipid or cholesterol levels are high. ? You are older than 46 years of age. ? You are at high risk for heart disease.  Cancer screening Lung Cancer  Lung cancer screening is recommended for adults 35-74 years old who are at high risk for lung cancer because of a history of smoking.  A yearly low-dose CT scan of the lungs is recommended for people who: ? Currently smoke. ? Have quit within the past 15 years. ? Have at least a 30-pack-year history of smoking. A pack year is smoking an average of one pack of cigarettes a day for 1 year.  Yearly screening should continue until it has been 15 years since you quit.  Yearly screening should stop if you develop a health problem that would prevent you from having lung cancer treatment.  Breast Cancer  Practice breast self-awareness. This means understanding how your breasts normally appear and feel.  It also means doing regular breast self-exams. Let your health care  changes, no matter how small.  If you are in your 20s or 30s, you should have a clinical breast exam (CBE) by a health care provider every 1-3 years as part of a regular health exam.  If you are 40 or older, have a CBE every year. Also consider having a breast X-ray (mammogram) every year.  If you have a family history of breast cancer, talk to your health care provider about genetic screening.  If you are at high risk for breast cancer, talk to your health care  provider about having an MRI and a mammogram every year.  Breast cancer gene (BRCA) assessment is recommended for women who have family members with BRCA-related cancers. BRCA-related cancers include: ? Breast. ? Ovarian. ? Tubal. ? Peritoneal cancers.  Results of the assessment will determine the need for genetic counseling and BRCA1 and BRCA2 testing.  Cervical Cancer Your health care provider may recommend that you be screened regularly for cancer of the pelvic organs (ovaries, uterus, and vagina). This screening involves a pelvic examination, including checking for microscopic changes to the surface of your cervix (Pap test). You may be encouraged to have this screening done every 3 years, beginning at age 21.  For women ages 30-65, health care providers may recommend pelvic exams and Pap testing every 3 years, or they may recommend the Pap and pelvic exam, combined with testing for human papilloma virus (HPV), every 5 years. Some types of HPV increase your risk of cervical cancer. Testing for HPV may also be done on women of any age with unclear Pap test results.  Other health care providers may not recommend any screening for nonpregnant women who are considered low risk for pelvic cancer and who do not have symptoms. Ask your health care provider if a screening pelvic exam is right for you.  If you have had past treatment for cervical cancer or a condition that could lead to cancer, you need Pap tests and screening for cancer for at least 20 years after your treatment. If Pap tests have been discontinued, your risk factors (such as having a new sexual partner) need to be reassessed to determine if screening should resume. Some women have medical problems that increase the chance of getting cervical cancer. In these cases, your health care provider may recommend more frequent screening and Pap tests.  Colorectal Cancer  This type of cancer can be detected and often prevented.  Routine  colorectal cancer screening usually begins at 46 years of age and continues through 46 years of age.  Your health care provider may recommend screening at an earlier age if you have risk factors for colon cancer.  Your health care provider may also recommend using home test kits to check for hidden blood in the stool.  A small camera at the end of a tube can be used to examine your colon directly (sigmoidoscopy or colonoscopy). This is done to check for the earliest forms of colorectal cancer.  Routine screening usually begins at age 50.  Direct examination of the colon should be repeated every 5-10 years through 46 years of age. However, you may need to be screened more often if early forms of precancerous polyps or small growths are found.  Skin Cancer  Check your skin from head to toe regularly.  Tell your health care provider about any new moles or changes in moles, especially if there is a change in a mole's shape or color.  Also tell your health care provider if   you have a mole that is larger than the size of a pencil eraser.  Always use sunscreen. Apply sunscreen liberally and repeatedly throughout the day.  Protect yourself by wearing long sleeves, pants, a wide-brimmed hat, and sunglasses whenever you are outside.  Heart disease, diabetes, and high blood pressure  High blood pressure causes heart disease and increases the risk of stroke. High blood pressure is more likely to develop in: ? People who have blood pressure in the high end of the normal range (130-139/85-89 mm Hg). ? People who are overweight or obese. ? People who are African American.  If you are 18-39 years of age, have your blood pressure checked every 3-5 years. If you are 40 years of age or older, have your blood pressure checked every year. You should have your blood pressure measured twice-once when you are at a hospital or clinic, and once when you are not at a hospital or clinic. Record the average of the  two measurements. To check your blood pressure when you are not at a hospital or clinic, you can use: ? An automated blood pressure machine at a pharmacy. ? A home blood pressure monitor.  If you are between 55 years and 79 years old, ask your health care provider if you should take aspirin to prevent strokes.  Have regular diabetes screenings. This involves taking a blood sample to check your fasting blood sugar level. ? If you are at a normal weight and have a low risk for diabetes, have this test once every three years after 45 years of age. ? If you are overweight and have a high risk for diabetes, consider being tested at a younger age or more often. Preventing infection Hepatitis B  If you have a higher risk for hepatitis B, you should be screened for this virus. You are considered at high risk for hepatitis B if: ? You were born in a country where hepatitis B is common. Ask your health care provider which countries are considered high risk. ? Your parents were born in a high-risk country, and you have not been immunized against hepatitis B (hepatitis B vaccine). ? You have HIV or AIDS. ? You use needles to inject street drugs. ? You live with someone who has hepatitis B. ? You have had sex with someone who has hepatitis B. ? You get hemodialysis treatment. ? You take certain medicines for conditions, including cancer, organ transplantation, and autoimmune conditions.  Hepatitis C  Blood testing is recommended for: ? Everyone born from 1945 through 1965. ? Anyone with known risk factors for hepatitis C.  Sexually transmitted infections (STIs)  You should be screened for sexually transmitted infections (STIs) including gonorrhea and chlamydia if: ? You are sexually active and are younger than 46 years of age. ? You are older than 46 years of age and your health care provider tells you that you are at risk for this type of infection. ? Your sexual activity has changed since you  were last screened and you are at an increased risk for chlamydia or gonorrhea. Ask your health care provider if you are at risk.  If you do not have HIV, but are at risk, it may be recommended that you take a prescription medicine daily to prevent HIV infection. This is called pre-exposure prophylaxis (PrEP). You are considered at risk if: ? You are sexually active and do not regularly use condoms or know the HIV status of your partner(s). ? You take drugs by   injection. ? You are sexually active with a partner who has HIV.  Talk with your health care provider about whether you are at high risk of being infected with HIV. If you choose to begin PrEP, you should first be tested for HIV. You should then be tested every 3 months for as long as you are taking PrEP. Pregnancy  If you are premenopausal and you may become pregnant, ask your health care provider about preconception counseling.  If you may become pregnant, take 400 to 800 micrograms (mcg) of folic acid every day.  If you want to prevent pregnancy, talk to your health care provider about birth control (contraception). Osteoporosis and menopause  Osteoporosis is a disease in which the bones lose minerals and strength with aging. This can result in serious bone fractures. Your risk for osteoporosis can be identified using a bone density scan.  If you are 65 years of age or older, or if you are at risk for osteoporosis and fractures, ask your health care provider if you should be screened.  Ask your health care provider whether you should take a calcium or vitamin D supplement to lower your risk for osteoporosis.  Menopause may have certain physical symptoms and risks.  Hormone replacement therapy may reduce some of these symptoms and risks. Talk to your health care provider about whether hormone replacement therapy is right for you. Follow these instructions at home:  Schedule regular health, dental, and eye exams.  Stay current  with your immunizations.  Do not use any tobacco products including cigarettes, chewing tobacco, or electronic cigarettes.  If you are pregnant, do not drink alcohol.  If you are breastfeeding, limit how much and how often you drink alcohol.  Limit alcohol intake to no more than 1 drink per day for nonpregnant women. One drink equals 12 ounces of beer, 5 ounces of wine, or 1 ounces of hard liquor.  Do not use street drugs.  Do not share needles.  Ask your health care provider for help if you need support or information about quitting drugs.  Tell your health care provider if you often feel depressed.  Tell your health care provider if you have ever been abused or do not feel safe at home. This information is not intended to replace advice given to you by your health care provider. Make sure you discuss any questions you have with your health care provider. Document Released: 07/23/2010 Document Revised: 06/15/2015 Document Reviewed: 10/11/2014 Elsevier Interactive Patient Education  2018 [employer]  

## 2016-10-31 ENCOUNTER — Ambulatory Visit (INDEPENDENT_AMBULATORY_CARE_PROVIDER_SITE_OTHER): Payer: BC Managed Care – PPO | Admitting: Obstetrics & Gynecology

## 2016-10-31 ENCOUNTER — Encounter: Payer: Self-pay | Admitting: Obstetrics & Gynecology

## 2016-10-31 VITALS — BP 132/84

## 2016-10-31 DIAGNOSIS — R300 Dysuria: Secondary | ICD-10-CM

## 2016-10-31 DIAGNOSIS — N898 Other specified noninflammatory disorders of vagina: Secondary | ICD-10-CM | POA: Diagnosis not present

## 2016-10-31 DIAGNOSIS — Z23 Encounter for immunization: Secondary | ICD-10-CM

## 2016-10-31 DIAGNOSIS — Z113 Encounter for screening for infections with a predominantly sexual mode of transmission: Secondary | ICD-10-CM

## 2016-10-31 LAB — WET PREP FOR TRICH, YEAST, CLUE

## 2016-10-31 MED ORDER — TINIDAZOLE 500 MG PO TABS: 2.0000 g | ORAL_TABLET | Freq: Every day | ORAL | 0 refills | Status: AC

## 2016-10-31 NOTE — Progress Notes (Signed)
    [name] [birth year] [id number]        46 y.o.  G2P2 New boyfriend  RP:  Vaginal d/c with vulvar discomfort and odor  HPI:  C/O increased vaginal d/c with odor.  Some vulvar discomfort and burning with or without miction.  No urinary frequency.  No pelvic pain.  No fever.  Sexually active with new partner, 1st time with condom, 2nd time without.    Past medical history,surgical history, problem list, medications, allergies, family history and social history were all reviewed and documented in the EPIC chart.  Directed ROS with pertinent positives and negatives documented in the history of present illness/assessment and plan.  Exam:  Vitals:   10/31/16 1503  BP: 132/84   General appearance:  Normal  No CVAT bilaterally, no suprapubic pain  Gyn exam:  Vulva normal.  Speculum:  Increased bubbly vaginal d/c with odor.  Wet prep done.  Gono-Chlam done.  Cervix/Vagina otherwise normal.  Bimanual exam:  Uterus AV, mobile, NT, normal volume.  No adnexal mass.   Assessment/Plan:  47 y.o. G2P2   1. Vaginal discharge Bacterial Vaginosis clinically and confirmed by Wet prep.  Treat with Tinidazole.  Usage reviewed, prescription sent. - WET PREP FOR TRICH, YEAST, CLUE  2. Vaginal odor Bacterial Vaginosis confirmed by Wet Prep. - WET PREP FOR [place], YEAST, CLUE  3. Dysuria U/A Cloudy, Nit neg, leuko trace, blood neg, bacteria moderate.  Moderate Clue Cells.  Will wait on U. Culture.  Probably not a Cystitis, but rather a Bacterial Vaginosis causing her Symptoms.  4. Screen for STD (sexually transmitted disease) Condom use strongly recommended. - HIV antibody (with reflex) - RPR - Hepatitis B Surface AntiGEN - Hepatitis C Antibody - GC/Chlamydia Probe Amp  Flu shot given.  Counseling on above issues >50% x 25 minutes.   Princess Bruins MD, 3:18 PM 10/31/2016

## 2016-10-31 NOTE — Patient Instructions (Signed)
1. Vaginal discharge Bacterial Vaginosis clinically and confirmed by Wet prep.  Treat with Tinidazole.  Usage reviewed, prescription sent. - WET PREP FOR TRICH, YEAST, CLUE  2. Vaginal odor Bacterial Vaginosis confirmed by Wet Prep. - WET PREP FOR [place], YEAST, CLUE  3. Dysuria U/A Cloudy, Nit neg, leuko trace, blood neg, bacteria moderate.  Moderate Clue Cells.  Will wait on U. Culture.  Probably not a Cystitis, but rather a Bacterial Vaginosis causing her Symptoms.  4. Screen for STD (sexually transmitted disease) Condom use strongly recommended. - HIV antibody (with reflex) - RPR - Hepatitis B Surface AntiGEN - Hepatitis C Antibody - GC/Chlamydia Probe Amp  Flu shot given.  Sharice, it was a pleasure to see you today!  I will inform you of all your results as soon as available.   How to Use a Condom Correctly, Adult Using a condom correctly and consistently is important for preventing pregnancy and the spread of sexually transmitted diseases (STDs). Condoms work by blocking contact with bodily fluids that can result in pregnancy or spread infection. This is called the barrier method. What are the different types of condoms? There are both female and female condoms. A female condom is a thin sheath that fits over an erect penis. Female condoms can be made from different materials, including:  Latex.  Polyurethane. This is a type of plastic.  Synthetic rubber.  Lambskin or other natural membranes.  Female condoms can be lubricated or unlubricated. A female condom is a thin pouch inserted into the vagina. An inner ring holds the condom in place. Another ring covers the outer folds of skin (labia). Female condoms are made from a rubber-like substance (nitrile). What do condoms prevent? Condoms can effectively prevent:  Pregnancy.  STDs that are transmitted through genital fluids. These include: ? HIV and AIDS. ? Gonorrhea. ? Chlamydia. ? Hepatitis B and C.  Condoms also offer  some protection from STDs that are transmitted through skin-to-skin contact if the infected area is covered by the condom. These infections include:  Syphilis.  Genital herpes.  Human papillomavirus (HPV).  Trichomoniasis.  Condoms made from lambskin or other natural membranes are not as effective at preventing STDs because some germs can pass through them. How do I use a condom? Female condom  Store condoms in a cool, dry place.  Before using a condom: ? Check the package to make sure the expiration date has not passed. ? Make sure that both the package and the condom do not have any holes, rips, or tears in them. ? Make sure that the condom is not brittle or discolored.  Make sure the condom is ready to be put on the right way. It should be ready to unroll downward.  Place the condom over your erect penis before engaging in any contact with your partner's mouth, anus, or vagina.  Pinch the tip of the condomwhile rolling it down to the base of the penis so that the entire penis is covered.  You can use water-based or silicone-based lubricants with female condoms. Do not use oil-based lubricants.  After you ejaculate, hold the rim of the condom as you withdraw.  Carefully pull off the condom away from your partner and be careful to avoid spills.  Wrap the condom in tissue or toilet paper and discard it in a trash can. Do not flush it down the toilet.  Do not use the same condom more than once. Use a new condom every time you have sex.  Female condom  Store condoms in a cool, dry place.  Before using a condom: ? Check the package to make sure the expiration date has not passed. ? Make sure that both the package and the condom do not have any holes, rips, or tears in them. ? Make sure that the condom is not brittle or discolored.  Place the condom inside your vagina before engaging in any contact with your partner's penis. To do this: ? Squeeze the inner ring and insert it  into the vagina like a tampon. ? Use your index finger to push it into place. There should be about one inch of condom outside of the vagina to expand during sex. ? Make sure the outer part of the condom completely covers the labia.  Female condoms are already lubricated. You can also use water-based or silicone-based lubricants with female condoms. Do not use oil-based lubricants.  Your partner should withdraw his penis shortly after ejaculating. Before you stand up, grasp the condom. Twist the outer part slightly to hold in fluid and carefully remove it.  Your partner can also grasp the condom and remove it at the same time he withdraws his penis.  Wrap the condom in tissue or toilet paper and discard it in a trash can. Do not flush it down the toilet.  Do not use the same condom more than once. Use a new condom every time you have sex.  Where can I get more information? Learn more about how to use a condom correctly from:  Centers for Disease Control and Prevention: ExpensiveJewels.hu  http://www.dixon-collier.info/: LendingParty.com.au  Planned Parenthood: https://www.plannedparenthood.org/learn/birth-control/condom/how-to-put-a-condom-on  This information is not intended to replace advice given to you by your health care provider. Make sure you discuss any questions you have with your health care provider. Document Released: 02/03/2015 Document Revised: 06/15/2015 Document Reviewed: 09/20/2015 Elsevier Interactive Patient Education  2017 [employer].

## 2016-11-01 LAB — HEPATITIS C ANTIBODY
Hepatitis C Ab: NONREACTIVE
SIGNAL TO CUT-OFF: 0.01 (ref <1.00)

## 2016-11-01 LAB — HIV ANTIBODY (ROUTINE TESTING W REFLEX): HIV: NONREACTIVE

## 2016-11-01 LAB — HEPATITIS B SURFACE ANTIGEN: Hepatitis B Surface Ag: NONREACTIVE

## 2016-11-01 LAB — C. TRACHOMATIS/N. GONORRHOEAE RNA
C. trachomatis RNA, TMA: NOT DETECTED
N. gonorrhoeae RNA, TMA: NOT DETECTED

## 2016-11-01 LAB — RPR: RPR: NONREACTIVE

## 2016-11-02 LAB — URINALYSIS W MICROSCOPIC + REFLEX CULTURE
Bilirubin Urine: NEGATIVE
GLUCOSE, UA: NEGATIVE
HGB URINE DIPSTICK: NEGATIVE
Hyaline Cast: NONE SEEN /LPF
Ketones, ur: NEGATIVE
Nitrites, Initial: NEGATIVE
PROTEIN: NEGATIVE
RBC / HPF: NONE SEEN /HPF (ref 0–2)
Specific Gravity, Urine: 1.01 (ref 1.001–1.03)
pH: 7 (ref 5.0–8.0)

## 2016-11-02 LAB — URINE CULTURE
MICRO NUMBER:: 81139759
SPECIMEN QUALITY:: ADEQUATE

## 2016-11-02 LAB — CULTURE INDICATED

## 2016-11-06 ENCOUNTER — Telehealth: Payer: Self-pay | Admitting: *Deleted

## 2016-11-06 NOTE — Telephone Encounter (Signed)
Patient informed with normal STD screening on OV 10/31/16. Had low grade fever, had flu shot, I explained could be related to flu shot to take tylenol.

## 2016-12-16 ENCOUNTER — Other Ambulatory Visit: Payer: Self-pay | Admitting: Obstetrics & Gynecology

## 2016-12-16 DIAGNOSIS — Z139 Encounter for screening, unspecified: Secondary | ICD-10-CM

## 2016-12-23 ENCOUNTER — Encounter: Payer: Self-pay | Admitting: Obstetrics & Gynecology

## 2016-12-23 ENCOUNTER — Ambulatory Visit (INDEPENDENT_AMBULATORY_CARE_PROVIDER_SITE_OTHER): Payer: BC Managed Care – PPO | Admitting: Obstetrics & Gynecology

## 2016-12-23 VITALS — BP 124/84

## 2016-12-23 DIAGNOSIS — N760 Acute vaginitis: Secondary | ICD-10-CM | POA: Diagnosis not present

## 2016-12-23 DIAGNOSIS — N898 Other specified noninflammatory disorders of vagina: Secondary | ICD-10-CM

## 2016-12-23 DIAGNOSIS — B9689 Other specified bacterial agents as the cause of diseases classified elsewhere: Secondary | ICD-10-CM | POA: Diagnosis not present

## 2016-12-23 LAB — WET PREP FOR TRICH, YEAST, CLUE

## 2016-12-23 MED ORDER — METRONIDAZOLE 0.75 % VA GEL: 1.0000 | Freq: Every day | VAGINAL | 1 refills | Status: AC

## 2016-12-23 MED ORDER — FLUCONAZOLE 150 MG PO TABS: 150.0000 mg | ORAL_TABLET | Freq: Once | ORAL | 1 refills | Status: AC

## 2016-12-23 NOTE — Progress Notes (Signed)
    [name] 1970-10-22 [id number]        46 y.o.  G2P2   RP:  Recurrence of increased vaginal d/c with odor  HPI:  Treated for BV 10/2016 with Tinidazole.  Better for a while, but now recurrence of vaginal d/c with odor.  Vulvar irritation.  On day # 5 or her menstrual period.  No pelvic pain.  Full STI w-up neg 11/11/2016.  Not recently sexually active.  No UTI Sx.  BMs wnl.  No fever.  Past medical history,surgical history, problem list, medications, allergies, family history and social history were all reviewed and documented in the EPIC chart.  Directed ROS with pertinent positives and negatives documented in the history of present illness/assessment and plan.  Exam:  Vitals:   12/23/16 1402  BP: 124/84   General appearance:  Normal  Gyn exam:  Vulva irritated, mild erythema.  Speculum:  Mild dark menstrual blood.  Wet prep done.  Wet prep:  Clue Cells present.   Assessment/Plan:  46 y.o. G2P2   1. Bacterial vaginosis Will treat with MetroGel vaginal application at bedtime x5.  After treatment will take 1 fluconazole tablet per mouth to treat or prevent yeast vaginitis.  Probiotic 1 tablet vaginally every week recommended for long-term prevention.  Counseling on above issues more than 50% for 15 minutes.  Princess Bruins MD, 2:11 PM 12/23/2016

## 2016-12-23 NOTE — Addendum Note (Signed)
Addended by: Thurnell Garbe A on: 12/23/2016 04:45 PM   Modules accepted: Orders

## 2016-12-23 NOTE — Patient Instructions (Signed)
1. Bacterial vaginosis Will treat with MetroGel vaginal application at bedtime x5.  After treatment will take 1 fluconazole tablet per mouth to treat or prevent yeast vaginitis.  Probiotic 1 tablet vaginally every week recommended for long-term prevention.  Caryl Pina, good seeing you today!

## 2016-12-24 ENCOUNTER — Telehealth: Payer: Self-pay | Admitting: *Deleted

## 2016-12-24 NOTE — Telephone Encounter (Signed)
Left message for pt to call regarding Probiotic 1 tablet vaginally.

## 2017-01-16 ENCOUNTER — Ambulatory Visit
Admission: RE | Admit: 2017-01-16 | Discharge: 2017-01-16 | Disposition: A | Discharge status: Home / Self Care | Payer: BC Managed Care – PPO | Source: Ambulatory Visit | Attending: Obstetrics & Gynecology | Admitting: Obstetrics & Gynecology

## 2017-01-16 DIAGNOSIS — Z139 Encounter for screening, unspecified: Secondary | ICD-10-CM

## 2017-01-23 ENCOUNTER — Encounter: Payer: Self-pay | Admitting: Gynecology

## 2017-01-23 ENCOUNTER — Ambulatory Visit: Payer: BC Managed Care – PPO | Admitting: Gynecology

## 2017-01-23 VITALS — BP 118/76

## 2017-01-23 DIAGNOSIS — R300 Dysuria: Secondary | ICD-10-CM | POA: Diagnosis not present

## 2017-01-23 DIAGNOSIS — N898 Other specified noninflammatory disorders of vagina: Secondary | ICD-10-CM

## 2017-01-23 LAB — WET PREP FOR TRICH, YEAST, CLUE

## 2017-01-23 MED ORDER — TERCONAZOLE 0.4 % VA CREA: 1.0000 | TOPICAL_CREAM | Freq: Every day | VAGINAL | 0 refills | Status: DC

## 2017-01-23 MED ORDER — METRONIDAZOLE 500 MG PO TABS: 500.0000 mg | ORAL_TABLET | Freq: Two times a day (BID) | ORAL | 0 refills | Status: DC

## 2017-01-23 NOTE — Addendum Note (Signed)
Addended by: Nelva Nay on: 01/23/2017 09:59 AM   Modules accepted: Orders

## 2017-01-23 NOTE — Progress Notes (Signed)
    [name] 10-07-1970 [id number]        47 y.o.  G2P2 resents with vaginal irritation and discharge.  Also noted a low-grade fever at 100.0 yesterday.  Was treated by Dr Dellis Filbert 10/2016 for bacterial vaginosis with Tindamax.  Was retreated 12/2016 with MetroGel and Diflucan due to recurrence of her symptoms.  She notes that she never really felt any better after that.  Continues with vaginal irritation and discharge.  No odor.  No UTI symptoms such as frequency dysuria urgency low back pain.  Past medical history,surgical history, problem list, medications, allergies, family history and social history were all reviewed and documented in the EPIC chart.  Directed ROS with pertinent positives and negatives documented in the history of present illness/assessment and plan.  Exam: Caryn Bee assistant Vitals:   01/23/17 0922  BP: 118/76   General appearance:  Normal Spine straight without CVA tenderness Abdomen soft nontender without masses guarding rebound Pelvic external BUS vagina with clumpy white discharge.  Cervix normal.  Bimanual without masses or tenderness.  Assessment/Plan:  47 y.o. G2P2 with recurrent vaginitis.  Wet prep is unremarkable.  Urine analysis is unremarkable.  I suspect that the patient has a partially treated bacterial vaginosis and possible yeast given the clinical appearance.  I am going to cover her for both with Flagyl 500 mg twice daily times 7 days to extend out her course past the 2-day Tindamax that she previously used and Terazol 7 night cream to treat resistant yeast.  Alcohol avoidance reviewed.  Patient will follow-up if her symptoms persist, worsen or recur.    Anastasio Auerbach MD, 9:38 AM 01/23/2017

## 2017-01-23 NOTE — Patient Instructions (Signed)
Take the Flagyl medication twice daily for 7 days.  Avoid alcohol while taking Use the Terazol vaginal cream nightly for 7 nights. Follow-up if symptoms persist, worsen or recur.

## 2017-01-24 LAB — URINALYSIS W MICROSCOPIC + REFLEX CULTURE
Bilirubin Urine: NEGATIVE
Glucose, UA: NEGATIVE
HYALINE CAST: NONE SEEN /LPF
Hgb urine dipstick: NEGATIVE
KETONES UR: NEGATIVE
Leukocyte Esterase: NEGATIVE
Nitrites, Initial: NEGATIVE
PH: 7 (ref 5.0–8.0)
PROTEIN: NEGATIVE
RBC / HPF: NONE SEEN /HPF (ref 0–2)
Specific Gravity, Urine: 1.02 (ref 1.001–1.03)
WBC UA: NONE SEEN /HPF (ref 0–5)

## 2017-01-24 LAB — NO CULTURE INDICATED

## 2017-01-29 ENCOUNTER — Telehealth: Payer: Self-pay

## 2017-01-29 NOTE — Telephone Encounter (Signed)
I called patient back. 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 I left message and read her the paragraph Dr. Loetta Rough wrote back to me recommending follow up with PCP.  I told her to call prn.

## 2017-01-29 NOTE — Telephone Encounter (Signed)
I am not sure it is related to GYN.  I was treating her for a vaginal discharge which should not give her these other symptoms. I guess the only other question would be whether she has a low-grade UTI although her urinalysis was not impressive.  I am not sure I would want to give her antibiotics without having a definite source of infection if that is the cause.  I would recommend following up with her primary physician to see why she is not feeling better.

## 2017-01-29 NOTE — Telephone Encounter (Signed)
Patient saw Dr. Loetta Rough last Thursday. Has almost finished antibiotic prescribed and still with low grade fever and just not feeling well. Asked if she should return for another visit or Dr. Loetta Rough could prescribe something else?

## 2017-01-30 ENCOUNTER — Encounter: Payer: Self-pay | Admitting: Family Medicine

## 2017-01-30 ENCOUNTER — Ambulatory Visit: Payer: BC Managed Care – PPO | Admitting: Family Medicine

## 2017-01-30 DIAGNOSIS — J069 Acute upper respiratory infection, unspecified: Secondary | ICD-10-CM

## 2017-01-30 NOTE — Progress Notes (Signed)
[name] - 47 y.o. female MRN [id number]  Date of birth: Feb 23, 1970  SUBJECTIVE:  Including CC & ROS.  Chief Complaint  Patient presents with  . Fever    [name] is a 47 y.o. female that is presenting with coryza and fatigue. Recorded temps have been 99. Ongoing for one week. Her daughter recently diagnosed with strep throat. Patient has been taking flagyl, being treated for bacterial vaginosis. Denies chills or body aches.     Review of Systems  Constitutional: Negative for fever.  HENT: Positive for rhinorrhea and sinus pressure.   Respiratory: Negative for shortness of breath.   Cardiovascular: Negative for chest pain.    HISTORY: Past Medical, Surgical, Social, and Family History Reviewed & Updated per EMR.   Pertinent Historical Findings include:  Past Medical History:  Diagnosis Date  . ALLERGIC RHINITIS   . ANXIETY   . GERD   . Gestational diabetes   . HYPERLIPIDEMIA   . Impaired glucose tolerance   . Tachycardia     Past Surgical History:  Procedure Laterality Date  . CESAREAN SECTION  2003/2008   x 2  . NASAL SINUS SURGERY  1993  . TONSILLECTOMY AND ADENOIDECTOMY      Allergies  Allergen Reactions  . Ciprofloxacin Rash  . Sulfonamide Derivatives Rash    Family History  Problem Relation Age of Onset  . Breast cancer Mother 82  . Colon polyps Mother   . Parkinson's disease Mother   . Atrial fibrillation Mother   . Colon polyps Father   . Diabetes Father   . Heart disease Father   . Liver disease Father      Social History   Socioeconomic History  . Marital status: Divorced    Spouse name: Not on file  . Number of children: 2  . Years of education: Not on file  . Highest education level: Not on file  Social Needs  . Financial resource strain: Not on file  . Food insecurity - worry: Not on file  . Food insecurity - inability: Not on file  . Transportation needs - medical: Not on file  . Transportation needs - non-medical: Not on  file  Occupational History  . Occupation: Product manager: [employer]  Tobacco Use  . Smoking status: Never Smoker  . Smokeless tobacco: Never Used  Substance and Sexual Activity  . Alcohol use: No    Comment: occasional-2 per month  . Drug use: No  . Sexual activity: Not Currently    Comment: 1ST INTERCOURSE- 26, PARTNERS- 3,   Other Topics Concern  . Not on file  Social History Narrative  . Not on file     PHYSICAL EXAM:  VS: BP 128/74 (BP Location: Left Arm, Patient Position: Sitting, Cuff Size: Normal)   Pulse 62   Temp 98.8 F (37.1 C) (Oral)   Ht 4\' 11"  (1.499 m)   Wt 185 lb (83.9 kg)   SpO2 99%   BMI 37.37 kg/m  Physical Exam Gen: NAD, alert, cooperative with exam,  ENT: normal lips, normal nasal mucosa, tympanic membranes clear and intact bilaterally, normal oropharynx, no cervical lymphadenopathy Eye: normal EOM, normal conjunctiva and lids CV:  no edema, +2 pedal pulses, regular rate and rhythm, S1-S2   Resp: no accessory muscle use, non-labored, clear to auscultation bilaterally, no crackles or wheezes Skin: no rashes, no areas of induration  Neuro: normal tone, normal sensation to touch Psych:  normal insight, alert and oriented  MSK: Normal gait, normal strength       ASSESSMENT & PLAN:   URI (upper respiratory infection) Likely viral in nature. 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 No fevers  - counseled on supportive care  - provided work note today  - f/u prn

## 2017-01-30 NOTE — Patient Instructions (Signed)
Please let us know if your symptoms worsen or you develop a temperature greater than 100.4

## 2017-01-30 NOTE — Assessment & Plan Note (Signed)
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 No fevers  - counseled on supportive care  - provided work note today  - f/u prn

## 2017-04-04 ENCOUNTER — Ambulatory Visit: Payer: BC Managed Care – PPO | Admitting: Family

## 2017-05-21 ENCOUNTER — Ambulatory Visit: Payer: BC Managed Care – PPO | Admitting: Obstetrics & Gynecology

## 2017-07-08 ENCOUNTER — Encounter: Payer: Self-pay | Admitting: Internal Medicine

## 2017-07-08 ENCOUNTER — Ambulatory Visit: Payer: BC Managed Care – PPO | Admitting: Internal Medicine

## 2017-07-08 VITALS — BP 128/88 | HR 92 | Temp 98.0°F | Ht 59.0 in | Wt 181.0 lb

## 2017-07-08 DIAGNOSIS — J069 Acute upper respiratory infection, unspecified: Secondary | ICD-10-CM

## 2017-07-08 DIAGNOSIS — R7302 Impaired glucose tolerance (oral): Secondary | ICD-10-CM

## 2017-07-08 DIAGNOSIS — I10 Essential (primary) hypertension: Secondary | ICD-10-CM | POA: Diagnosis not present

## 2017-07-08 DIAGNOSIS — L290 Pruritus ani: Secondary | ICD-10-CM | POA: Diagnosis not present

## 2017-07-08 MED ORDER — HYDROCODONE-HOMATROPINE 5-1.5 MG/5ML PO SYRP: 5.0000 mL | ORAL_SOLUTION | Freq: Four times a day (QID) | ORAL | 0 refills | Status: DC | PRN

## 2017-07-08 MED ORDER — AZITHROMYCIN 250 MG PO TABS: ORAL_TABLET | ORAL | 1 refills | Status: DC

## 2017-07-08 MED ORDER — HYDROCORTISONE 2.5 % RE CREA: 1.0000 "application " | TOPICAL_CREAM | Freq: Two times a day (BID) | RECTAL | 0 refills | Status: DC

## 2017-07-08 NOTE — Patient Instructions (Addendum)
Please take all new medication as prescribed - the antibiotic, cough medicine, and cream as needed  You will be contacted regarding the referral for: Gastroenterology  Please continue all other medications as before, and refills have been done if requested.  Please have the pharmacy call with any other refills you may need.  Please keep your appointments with your specialists as you may have planned

## 2017-07-08 NOTE — Assessment & Plan Note (Signed)
stable overall by history and exam, recent data reviewed with pt, and pt to continue medical treatment as before,  to f/u any worsening symptoms or concerns Lab Results  Component Value Date   HGBA1C 5.9 07/12/2015   For f/u a1c with next labs

## 2017-07-08 NOTE — Assessment & Plan Note (Signed)
stable overall by history and exam, recent data reviewed with pt, and pt to continue medical treatment as before,  to f/u any worsening symptoms or concerns BP Readings from Last 3 Encounters:  07/08/17 128/88  01/30/17 128/74  01/23/17 118/76

## 2017-07-08 NOTE — Progress Notes (Signed)
   Subjective:    Patient ID: [name], female    DOB: [birth year], 47 y.o.   MRN: [id number]  HPI   Here with 2-3 days acute onset fever, facial pain, pressure, headache, general weakness and malaise, and greenish d/c, with mild ST and cough, but pt denies chest pain, wheezing, increased sob or doe, orthopnea, PND, increased LE swelling, palpitations, dizziness or syncope.  Also with itching and discomfort anal without fever, swelling, drainage for 2 yrs, asks for GI referral  Pt denies new neurological symptoms such as new headache, or facial or extremity weakness or numbness   Pt denies polydipsia, polyuria Past Medical History:  Diagnosis Date  . ALLERGIC RHINITIS   . ANXIETY   . GERD   . Gestational diabetes   . HYPERLIPIDEMIA   . Impaired glucose tolerance   . Tachycardia    Past Surgical History:  Procedure Laterality Date  . CESAREAN SECTION  2003/2008   x 2  . NASAL SINUS SURGERY  1993  . TONSILLECTOMY AND ADENOIDECTOMY      reports that she has never smoked. She has never used smokeless tobacco. She reports that she does not drink alcohol or use drugs. family history includes Atrial fibrillation in her mother; Breast cancer (age of onset: 39) in her mother; Colon polyps in her father and mother; Diabetes in her father; Heart disease in her father; Liver disease in her father; Parkinson's disease in her mother. Allergies  Allergen Reactions  . Ciprofloxacin Rash  . Sulfonamide Derivatives Rash   Current Outpatient Medications on File Prior to Visit  Medication Sig Dispense Refill  . metoprolol tartrate (LOPRESSOR) 25 MG tablet Take 1 tablet (25 mg total) by mouth daily. 90 tablet 3   No current facility-administered medications on file prior to visit.    Review of Systems  Constitutional: Negative for other unusual diaphoresis or sweats HENT: Negative for ear discharge or swelling Eyes: Negative for other worsening visual disturbances Respiratory: Negative for  stridor or other swelling  Gastrointestinal: Negative for worsening distension or other blood Genitourinary: Negative for retention or other urinary change Musculoskeletal: Negative for other MSK pain or swelling Skin: Negative for color change or other new lesions Neurological: Negative for worsening tremors and other numbness  Psychiatric/Behavioral: Negative for worsening agitation or other fatigue All other system neg per pt    Objective:   Physical Exam BP 128/88   Pulse 92   Temp 98 F (36.7 C) (Oral)   Ht 4\' 11"  (1.499 m)   Wt 181 lb (82.1 kg)   SpO2 97%   BMI 36.56 kg/m  VS noted, mild ill Constitutional: Pt appears in NAD HENT: Head: NCAT.  Right Ear: External ear normal.  Left Ear: External ear normal.  Bilat tm's with mild erythema.  Max sinus areas mild tender.  Pharynx with mild erythema, no exudate Eyes: . Pupils are equal, round, and reactive to light. Conjunctivae and EOM are normal Nose: without d/c or deformity Neck: Neck supple. Gross normal ROM Cardiovascular: Normal rate and regular rhythm.   Pulmonary/Chest: Effort normal and breath sounds without rales or wheezing.  Abd:  Soft, NT, ND, + BS, no organomegaly; DRE deferred per pt Neurological: Pt is alert. At baseline orientation, motor grossly intact Skin: Skin is warm. No rashes, other new lesions, no LE edema Psychiatric: Pt behavior is normal without agitation  No other exam findings    Assessment & Plan:

## 2017-07-08 NOTE — Assessment & Plan Note (Signed)
For anusol HC asd, refer GI

## 2017-07-08 NOTE — Assessment & Plan Note (Signed)
Mild to mod, for antibx course,  to f/u any worsening symptoms or concerns 

## 2017-07-10 DIAGNOSIS — S46911A Strain of unspecified muscle, fascia and tendon at shoulder and upper arm level, right arm, initial encounter: Secondary | ICD-10-CM | POA: Insufficient documentation

## 2017-07-15 ENCOUNTER — Encounter: Payer: Self-pay | Admitting: Internal Medicine

## 2017-07-15 ENCOUNTER — Ambulatory Visit (INDEPENDENT_AMBULATORY_CARE_PROVIDER_SITE_OTHER): Payer: BC Managed Care – PPO | Admitting: Internal Medicine

## 2017-07-15 ENCOUNTER — Other Ambulatory Visit (INDEPENDENT_AMBULATORY_CARE_PROVIDER_SITE_OTHER): Payer: BC Managed Care – PPO

## 2017-07-15 VITALS — BP 118/82 | HR 82 | Temp 97.8°F | Ht 59.0 in | Wt 181.0 lb

## 2017-07-15 DIAGNOSIS — Z0000 Encounter for general adult medical examination without abnormal findings: Secondary | ICD-10-CM | POA: Diagnosis not present

## 2017-07-15 DIAGNOSIS — R7302 Impaired glucose tolerance (oral): Secondary | ICD-10-CM

## 2017-07-15 LAB — HEPATIC FUNCTION PANEL
ALBUMIN: 4 g/dL (ref 3.5–5.2)
ALK PHOS: 47 U/L (ref 39–117)
ALT: 20 U/L (ref 0–35)
AST: 19 U/L (ref 0–37)
Bilirubin, Direct: 0.1 mg/dL (ref 0.0–0.3)
TOTAL PROTEIN: 6.9 g/dL (ref 6.0–8.3)
Total Bilirubin: 0.4 mg/dL (ref 0.2–1.2)

## 2017-07-15 LAB — BASIC METABOLIC PANEL
BUN: 10 mg/dL (ref 6–23)
CALCIUM: 9.2 mg/dL (ref 8.4–10.5)
CO2: 29 mEq/L (ref 19–32)
Chloride: 104 mEq/L (ref 96–112)
Creatinine, Ser: 0.53 mg/dL (ref 0.40–1.20)
GFR: 131.45 mL/min (ref >60.00)
Glucose, Bld: 109 mg/dL — ABNORMAL HIGH (ref 70–99)
Potassium: 4.1 mEq/L (ref 3.5–5.1)
SODIUM: 139 meq/L (ref 135–145)

## 2017-07-15 LAB — URINALYSIS, ROUTINE W REFLEX MICROSCOPIC
Bilirubin Urine: NEGATIVE
KETONES UR: NEGATIVE
LEUKOCYTES UA: NEGATIVE
Nitrite: NEGATIVE
SPECIFIC GRAVITY, URINE: 1.01 (ref 1.000–1.030)
Total Protein, Urine: NEGATIVE
Urine Glucose: NEGATIVE
Urobilinogen, UA: 0.2 (ref 0.0–1.0)
pH: 8 (ref 5.0–8.0)

## 2017-07-15 LAB — CBC WITH DIFFERENTIAL/PLATELET
Basophils Absolute: 0.1 10*3/uL (ref 0.0–0.1)
Basophils Relative: 0.7 % (ref 0.0–3.0)
EOS PCT: 7.5 % — AB (ref 0.0–5.0)
Eosinophils Absolute: 0.7 10*3/uL (ref 0.0–0.7)
HCT: 39.2 % (ref 36.0–46.0)
Hemoglobin: 13.3 g/dL (ref 12.0–15.0)
LYMPHS ABS: 2.1 10*3/uL (ref 0.7–4.0)
Lymphocytes Relative: 23.4 % (ref 12.0–46.0)
MCHC: 33.9 g/dL (ref 30.0–36.0)
MCV: 87.7 fl (ref 78.0–100.0)
MONO ABS: 0.6 10*3/uL (ref 0.1–1.0)
Monocytes Relative: 6.4 % (ref 3.0–12.0)
NEUTROS ABS: 5.6 10*3/uL (ref 1.4–7.7)
Neutrophils Relative %: 62 % (ref 43.0–77.0)
Platelets: 346 10*3/uL (ref 150.0–400.0)
RBC: 4.48 Mil/uL (ref 3.87–5.11)
RDW: 13 % (ref 11.5–15.5)
WBC: 8.9 10*3/uL (ref 4.0–10.5)

## 2017-07-15 LAB — LIPID PANEL
CHOL/HDL RATIO: 4
CHOLESTEROL: 190 mg/dL (ref 0–200)
HDL: 53.5 mg/dL (ref >39.00)
LDL Cholesterol: 119 mg/dL — ABNORMAL HIGH (ref 0–99)
NonHDL: 136.96
TRIGLYCERIDES: 92 mg/dL (ref 0.0–149.0)
VLDL: 18.4 mg/dL (ref 0.0–40.0)

## 2017-07-15 LAB — HEMOGLOBIN A1C: Hgb A1c MFr Bld: 6.3 % (ref 4.6–6.5)

## 2017-07-15 LAB — TSH: TSH: 1.86 u[IU]/mL (ref 0.35–4.50)

## 2017-07-15 NOTE — Assessment & Plan Note (Signed)
stable overall by history and exam, recent data reviewed with pt, and pt to continue medical treatment as before,  to f/u any worsening symptoms or concerns Lab Results  Component Value Date   HGBA1C 6.3 07/15/2017   

## 2017-07-15 NOTE — Progress Notes (Signed)
Subjective:    Patient ID: [name], female    DOB: November 16, 1970, 47 y.o.   MRN: [id number]  HPI  Here for wellness and f/u;  Overall doing ok;  Pt denies Chest pain, worsening SOB, DOE, wheezing, orthopnea, PND, worsening LE edema, palpitations, dizziness or syncope.  Pt denies neurological change such as new headache, facial or extremity weakness.  Pt denies polydipsia, polyuria, or low sugar symptoms. Pt states overall good compliance with treatment and medications, good tolerability, and has been trying to follow appropriate diet.  Pt denies worsening depressive symptoms, suicidal ideation or panic. No fever, night sweats, wt loss, loss of appetite, or other constitutional symptoms.  Pt states good ability with ADL's, has low fall risk, home safety reviewed and adequate, no other significant changes in hearing or vision, and only occasionally active with exercise.  No new complaints Wt Readings from Last 3 Encounters:  07/15/17 181 lb (82.1 kg)  07/08/17 181 lb (82.1 kg)  01/30/17 185 lb (83.9 kg)   Past Medical History:  Diagnosis Date  . ALLERGIC RHINITIS   . ANXIETY   . GERD   . Gestational diabetes   . HYPERLIPIDEMIA   . Impaired glucose tolerance   . Tachycardia    Past Surgical History:  Procedure Laterality Date  . CESAREAN SECTION  2003/2008   x 2  . NASAL SINUS SURGERY  1993  . TONSILLECTOMY AND ADENOIDECTOMY      reports that she has never smoked. She has never used smokeless tobacco. She reports that she does not drink alcohol or use drugs. family history includes Atrial fibrillation in her mother; Breast cancer (age of onset: 28) in her mother; Colon polyps in her father and mother; Diabetes in her father; Heart disease in her father; Liver disease in her father; Parkinson's disease in her mother. Allergies  Allergen Reactions  . Ciprofloxacin Rash  . Sulfonamide Derivatives Rash   Current Outpatient Medications on File Prior to Visit  Medication Sig Dispense  Refill  . hydrocortisone (ANUSOL-HC) 2.5 % rectal cream Place 1 application rectally 2 (two) times daily. 30 g 0  . metoprolol tartrate (LOPRESSOR) 25 MG tablet Take 1 tablet (25 mg total) by mouth daily. 90 tablet 3   No current facility-administered medications on file prior to visit.    Review of Systems Constitutional: Negative for other unusual diaphoresis, sweats, appetite or weight changes HENT: Negative for other worsening hearing loss, ear pain, facial swelling, mouth sores or neck stiffness.   Eyes: Negative for other worsening pain, redness or other visual disturbance.  Respiratory: Negative for other stridor or swelling Cardiovascular: Negative for other palpitations or other chest pain  Gastrointestinal: Negative for worsening diarrhea or loose stools, blood in stool, distention or other pain Genitourinary: Negative for hematuria, flank pain or other change in urine volume.  Musculoskeletal: Negative for myalgias or other joint swelling.  Skin: Negative for other color change, or other wound or worsening drainage.  Neurological: Negative for other syncope or numbness. Hematological: Negative for other adenopathy or swelling Psychiatric/Behavioral: Negative for hallucinations, other worsening agitation, SI, self-injury, or new decreased concentration All other system neg per pt    Objective:   Physical Exam BP 118/82   Pulse 82   Temp 97.8 F (36.6 C) (Oral)   Ht 4\' 11"  (1.499 m)   Wt 181 lb (82.1 kg)   SpO2 97%   BMI 36.56 kg/m  VS noted,  Constitutional: Pt is oriented to person, place, and time.  Appears well-developed and well-nourished, in no significant distress and comfortable Head: Normocephalic and atraumatic  Eyes: Conjunctivae and EOM are normal. Pupils are equal, round, and reactive to light Right Ear: External ear normal without discharge Left Ear: External ear normal without discharge Nose: Nose without discharge or deformity Mouth/Throat: Oropharynx is  without other ulcerations and moist  Neck: Normal range of motion. Neck supple. No JVD present. No tracheal deviation present or significant neck LA or mass Cardiovascular: Normal rate, regular rhythm, normal heart sounds and intact distal pulses.  Pulmonary/Chest: WOB normal and breath sounds without rales or wheezing  Abdominal: Soft. Bowel sounds are normal. NT. No HSM  Musculoskeletal: Normal range of motion. Exhibits no edema Lymphadenopathy: Has no other cervical adenopathy.  Neurological: Pt is alert and oriented to person, place, and time. Pt has normal reflexes. No cranial nerve deficit. Motor grossly intact, Gait intact Skin: Skin is warm and dry. No rash noted or new ulcerations Psychiatric:  Has normal mood and affect. Behavior is normal without agitation No other exam findings Lab Results  Component Value Date   WBC 8.9 07/15/2017   HGB 13.3 07/15/2017   HCT 39.2 07/15/2017   PLT 346.0 07/15/2017   GLUCOSE 109 (H) 07/15/2017   CHOL 190 07/15/2017   TRIG 92.0 07/15/2017   HDL 53.50 07/15/2017   LDLDIRECT 142.5 09/01/2006   LDLCALC 119 (H) 07/15/2017   ALT 20 07/15/2017   AST 19 07/15/2017   NA 139 07/15/2017   K 4.1 07/15/2017   CL 104 07/15/2017   CREATININE 0.53 07/15/2017   BUN 10 07/15/2017   CO2 29 07/15/2017   TSH 1.86 07/15/2017   HGBA1C 6.3 07/15/2017       Assessment & Plan:

## 2017-07-15 NOTE — Assessment & Plan Note (Signed)

## 2017-07-15 NOTE — Patient Instructions (Signed)

## 2017-07-23 ENCOUNTER — Ambulatory Visit: Payer: BC Managed Care – PPO | Admitting: Nurse Practitioner

## 2017-08-06 ENCOUNTER — Encounter: Payer: Self-pay | Admitting: Internal Medicine

## 2017-08-06 ENCOUNTER — Ambulatory Visit: Payer: BC Managed Care – PPO | Admitting: Nurse Practitioner

## 2017-08-30 ENCOUNTER — Other Ambulatory Visit: Payer: Self-pay | Admitting: Internal Medicine

## 2017-10-15 ENCOUNTER — Ambulatory Visit: Payer: BC Managed Care – PPO | Admitting: Obstetrics & Gynecology

## 2017-10-15 ENCOUNTER — Encounter: Payer: Self-pay | Admitting: Obstetrics & Gynecology

## 2017-10-15 VITALS — BP 126/82 | Ht 60.0 in | Wt 186.0 lb

## 2017-10-15 DIAGNOSIS — N762 Acute vulvitis: Secondary | ICD-10-CM

## 2017-10-15 DIAGNOSIS — Z01419 Encounter for gynecological examination (general) (routine) without abnormal findings: Secondary | ICD-10-CM

## 2017-10-15 DIAGNOSIS — Z23 Encounter for immunization: Secondary | ICD-10-CM | POA: Diagnosis not present

## 2017-10-15 DIAGNOSIS — N898 Other specified noninflammatory disorders of vagina: Secondary | ICD-10-CM | POA: Diagnosis not present

## 2017-10-15 DIAGNOSIS — Z789 Other specified health status: Secondary | ICD-10-CM

## 2017-10-15 LAB — WET PREP FOR TRICH, YEAST, CLUE

## 2017-10-15 MED ORDER — CLOBETASOL PROPIONATE 0.05 % EX OINT: 1.0000 "application " | TOPICAL_OINTMENT | Freq: Two times a day (BID) | CUTANEOUS | 0 refills | Status: AC

## 2017-10-15 MED ORDER — FLUCONAZOLE 150 MG PO TABS: 150.0000 mg | ORAL_TABLET | Freq: Every day | ORAL | 2 refills | Status: AC

## 2017-10-15 NOTE — Patient Instructions (Signed)
1. Well female exam with routine gynecological exam Normal gynecologic exam.  Pap test negative with negative high-risk HPV August 2018.  No indication to repeat this year.  Breast exam normal.  Will schedule screening mammogram in December 2019.  Body mass index 36.33.  Recommend a low calorie/low carb diet such as [employer].  Continue with regular walking, aerobic activities 5 times a week and weightlifting every 2 days.  Health labs with family physician.  2. Uses abstinence for birth control  3. Vaginal discharge Clinically and confirmed by wet prep, patient has a yeast vaginitis.  Will treat with fluconazole 150 mg 1 tablet per mouth daily for 3 days.  Usage reviewed with patient and prescription sent to pharmacy.  Recommend probiotic tablets either per mouth or vaginally for prevention. - WET PREP FOR [place], YEAST, CLUE  4. Acute vulvitis Probably secondary to yeast vaginitis.  Will treat the yeast vaginitis with fluconazole and then patient will use clobetasol ointment a thin layer on the affected vulva and perineum twice a day for 14 days.  Usage reviewed and prescription sent to pharmacy.  5. Needs flu shot Flu shot given today  Other orders - clobetasol ointment (TEMOVATE) 0.05 %; Apply 1 application topically 2 (two) times daily for 14 days. Thin vulvar and perineal application - fluconazole (DIFLUCAN) 150 MG tablet; Take 1 tablet (150 mg total) by mouth daily for 3 days.  Velda, it was a pleasure seeing you today!

## 2017-10-15 NOTE — Progress Notes (Signed)
[name] 04/03/1970 [id number]   History:    47 y.o. G2P2L2 Divorced.  Daughter 6th grade.  Son driving.  RP:  Established patient presenting for annual gyn exam   HPI: Menstrual periods regular every month.  No breakthrough bleeding.  No pelvic pain.  Abstinent.  Complains of increased vaginal discharge with vulvar and perineal irritation.  Using an anal cream for an external hemorrhoid.  Urine and bowel movements normal.  Breasts normal.  Body mass index 36.33.  Walking regularly.  Health labs with family physician.  Past medical history,surgical history, family history and social history were all reviewed and documented in the EPIC chart.  Gynecologic History Patient's last menstrual period was 09/21/2017. Contraception: abstinence Last Pap: 08/2016.  Results were: Negative, HPV HR neg Last mammogram: 12/2016. Results were: Negative Bone Density: Never Colonoscopy: Never  Obstetric History OB History  Gravida Para Term Preterm AB Living  2 2       2   SAB TAB Ectopic Multiple Live Births               # Outcome Date GA Lbr Len/2nd Weight Sex Delivery Anes PTL Lv  2 Para     F CS-Unspec     1 Para     M CS-Unspec        ROS: A ROS was performed and pertinent positives and negatives are included in the history.  GENERAL: No fevers or chills. HEENT: No change in vision, no earache, sore throat or sinus congestion. NECK: No pain or stiffness. CARDIOVASCULAR: No chest pain or pressure. No palpitations. PULMONARY: No shortness of breath, cough or wheeze. GASTROINTESTINAL: No abdominal pain, nausea, vomiting or diarrhea, melena or bright red blood per rectum. GENITOURINARY: No urinary frequency, urgency, hesitancy or dysuria. MUSCULOSKELETAL: No joint or muscle pain, no back pain, no recent trauma. DERMATOLOGIC: No rash, no itching, no lesions. ENDOCRINE: No polyuria, polydipsia, no heat or cold intolerance. No recent change in weight. HEMATOLOGICAL: No anemia or easy bruising or  bleeding. NEUROLOGIC: No headache, seizures, numbness, tingling or weakness. PSYCHIATRIC: No depression, no loss of interest in normal activity or change in sleep pattern.     Exam:   BP 126/82   Ht 5' (1.524 m)   Wt 186 lb (84.4 kg)   LMP 09/21/2017   BMI 36.33 kg/m   Body mass index is 36.33 kg/m.  General appearance : Well developed well nourished female. No acute distress HEENT: Eyes: no retinal hemorrhage or exudates,  Neck supple, trachea midline, no carotid bruits, no thyroidmegaly Lungs: Clear to auscultation, no rhonchi or wheezes, or rib retractions  Heart: Regular rate and rhythm, no murmurs or gallops Breast:Examined in sitting and supine position were symmetrical in appearance, no palpable masses or tenderness,  no skin retraction, no nipple inversion, no nipple discharge, no skin discoloration, no axillary or supraclavicular lymphadenopathy Abdomen: no palpable masses or tenderness, no rebound or guarding Extremities: no edema or skin discoloration or tenderness  Pelvic: Vulva: Mild vulvitis and irritation at the perineum             Vagina: No gross lesions.  Increased vaginal discharge.  Wet prep done  Cervix: No gross lesions or discharge  Uterus  AV, normal size, shape and consistency, non-tender and mobile  Adnexa  Without masses or tenderness  Anus: Very small Non-thrombosed external hemorrhoid.  Rectal exam negative otherwise.   Assessment/Plan:  47 y.o. female for annual exam   1. Well female exam with  routine gynecological exam Normal gynecologic exam.  Pap test negative with negative high-risk HPV August 2018.  No indication to repeat this year.  Breast exam normal.  Will schedule screening mammogram in December 2019.  Body mass index 36.33.  Recommend a low calorie/low carb diet such as [employer].  Continue with regular walking, aerobic activities 5 times a week and weightlifting every 2 days.  Health labs with family physician.  2. Uses abstinence  for birth control  3. Vaginal discharge Clinically and confirmed by wet prep, patient has a yeast vaginitis.  Will treat with fluconazole 150 mg 1 tablet per mouth daily for 3 days.  Usage reviewed with patient and prescription sent to pharmacy.  Recommend probiotic tablets either per mouth or vaginally for prevention. - WET PREP FOR [place], YEAST, CLUE  4. Acute vulvitis Probably secondary to yeast vaginitis.  Will treat the yeast vaginitis with fluconazole and then patient will use clobetasol ointment a thin layer on the affected vulva and perineum twice a day for 14 days.  Usage reviewed and prescription sent to pharmacy.  5. Needs flu shot Flu shot given today  Other orders - clobetasol ointment (TEMOVATE) 0.05 %; Apply 1 application topically 2 (two) times daily for 14 days. Thin vulvar and perineal application - fluconazole (DIFLUCAN) 150 MG tablet; Take 1 tablet (150 mg total) by mouth daily for 3 days.  Counseling on above issues and coordination of care more than 50% for 10 minutes.  Princess Bruins MD, 3:38 PM 10/15/2017

## 2017-10-15 NOTE — Addendum Note (Signed)
Addended by: Thurnell Garbe A on: 10/15/2017 04:53 PM   Modules accepted: Orders

## 2017-12-25 ENCOUNTER — Other Ambulatory Visit: Payer: Self-pay | Admitting: Obstetrics & Gynecology

## 2017-12-25 DIAGNOSIS — Z1231 Encounter for screening mammogram for malignant neoplasm of breast: Secondary | ICD-10-CM

## 2018-02-02 ENCOUNTER — Ambulatory Visit
Admission: RE | Admit: 2018-02-02 | Discharge: 2018-02-02 | Disposition: A | Discharge status: Home / Self Care | Payer: BC Managed Care – PPO | Source: Ambulatory Visit | Attending: Obstetrics & Gynecology | Admitting: Obstetrics & Gynecology

## 2018-02-02 DIAGNOSIS — Z1231 Encounter for screening mammogram for malignant neoplasm of breast: Secondary | ICD-10-CM

## 2018-02-25 ENCOUNTER — Encounter: Payer: Self-pay | Admitting: Women's Health

## 2018-02-25 ENCOUNTER — Ambulatory Visit: Payer: BC Managed Care – PPO | Admitting: Women's Health

## 2018-02-25 VITALS — BP 116/78

## 2018-02-25 DIAGNOSIS — N898 Other specified noninflammatory disorders of vagina: Secondary | ICD-10-CM | POA: Diagnosis not present

## 2018-02-25 DIAGNOSIS — Z113 Encounter for screening for infections with a predominantly sexual mode of transmission: Secondary | ICD-10-CM

## 2018-02-25 LAB — WET PREP FOR TRICH, YEAST, CLUE

## 2018-02-25 NOTE — Progress Notes (Signed)
48 year old D WF G2 P2 presents with complaint of watery discharge with questionable odor for the past week.  Denies vaginal itching, abdominal/back pain, urinary symptoms, or fever.  Regular monthly cycle/BTL.  Last sexually active greater than 4 months ago, new partner ED.  No known medical problems, morbid obesity.  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  Exam: Appears well.  No CVAT.  Abdomen soft, obese, nontender.  External genitalia within normal limits, speculum exam moderate amount of a clear white discharge consistent with ovulation type discharge without odor noted, no erythema.  GC/chlamydia culture taken.  Bimanual no CMT or adnexal tenderness.  Normal exam  Plan: Reviewed normality of vaginal discharge, reviewed it appears to be ovulation type discharge.  Reviewed best to watch at this time, condoms if becomes sexually active.

## 2018-02-26 LAB — C. TRACHOMATIS/N. GONORRHOEAE RNA
C. trachomatis RNA, TMA: NOT DETECTED
N. gonorrhoeae RNA, TMA: NOT DETECTED

## 2018-02-27 ENCOUNTER — Ambulatory Visit: Payer: BC Managed Care – PPO | Admitting: Obstetrics & Gynecology

## 2018-08-10 ENCOUNTER — Ambulatory Visit: Payer: BC Managed Care – PPO | Admitting: Internal Medicine

## 2018-08-12 ENCOUNTER — Ambulatory Visit (INDEPENDENT_AMBULATORY_CARE_PROVIDER_SITE_OTHER): Payer: BC Managed Care – PPO | Admitting: Internal Medicine

## 2018-08-12 ENCOUNTER — Encounter: Payer: Self-pay | Admitting: Internal Medicine

## 2018-08-12 DIAGNOSIS — J309 Allergic rhinitis, unspecified: Secondary | ICD-10-CM

## 2018-08-12 DIAGNOSIS — F411 Generalized anxiety disorder: Secondary | ICD-10-CM

## 2018-08-12 DIAGNOSIS — R7302 Impaired glucose tolerance (oral): Secondary | ICD-10-CM

## 2018-08-12 MED ORDER — CETIRIZINE HCL 10 MG PO TABS: 10.0000 mg | ORAL_TABLET | Freq: Every day | ORAL | 11 refills | Status: DC

## 2018-08-12 MED ORDER — PREDNISONE 10 MG PO TABS: ORAL_TABLET | ORAL | 0 refills | Status: DC

## 2018-08-12 MED ORDER — TRIAMCINOLONE ACETONIDE 55 MCG/ACT NA AERO: 2.0000 | INHALATION_SPRAY | Freq: Every day | NASAL | 12 refills | Status: DC

## 2018-08-12 NOTE — Patient Instructions (Signed)
Please take all new medication as prescribed  - the zyrtec, nasacort, and prednisone  Please continue all other medications as before, and refills have been done if requested.  Please have the pharmacy call with any other refills you may need.  Please keep your appointments with your specialists as you may have planned

## 2018-08-12 NOTE — Assessment & Plan Note (Signed)
With seasonal flare, for zyrtec, nasacort and short course low dose predpac asd,  to f/u any worsening symptoms or concerns

## 2018-08-12 NOTE — Assessment & Plan Note (Signed)
stable overall by history and exam, recent data reviewed with pt, and pt to continue medical treatment as before,  to f/u any worsening symptoms or concerns  

## 2018-08-12 NOTE — Progress Notes (Signed)
Patient ID: [name], female   DOB: 1970/06/26, 48 y.o.   MRN: [id number]  Virtual Visit via Video Note  I connected with [name] on 08/12/18 at  8:40 AM EDT by a video enabled telemedicine application and verified that I am speaking with the correct person using two identifiers.  Location: Patient: at home Provider: at office   I discussed the limitations of evaluation and management by telemedicine and the availability of in person appointments. The patient expressed understanding and agreed to proceed.  History of Present Illness: Here to f/u; Does have several wks ongoing nasal allergy symptoms with clearish congestion, itch and sneezing, without fever, pain, ST, cough, swelling or wheezing.   Pt denies fever, wt loss, night sweats, loss of appetite, or other constitutional symptoms  Pt denies chest pain, increased sob or doe, wheezing, orthopnea, PND, increased LE swelling, palpitations, dizziness or syncope.   Pt denies polydipsia, polyuria.  Denies worsening depressive symptoms, suicidal ideation, or panic Past Medical History:  Diagnosis Date  . ALLERGIC RHINITIS   . ANXIETY   . GERD   . Gestational diabetes   . HYPERLIPIDEMIA   . Impaired glucose tolerance   . Tachycardia    Past Surgical History:  Procedure Laterality Date  . CESAREAN SECTION  2003/2008   x 2  . NASAL SINUS SURGERY  1993  . TONSILLECTOMY AND ADENOIDECTOMY      reports that she has never smoked. She has never used smokeless tobacco. She reports that she does not drink alcohol or use drugs. family history includes Atrial fibrillation in her mother; Breast cancer (age of onset: 20) in her mother; Colon polyps in her father and mother; Diabetes in her father; Heart disease in her father; Liver disease in her father; Parkinson's disease in her mother. Allergies  Allergen Reactions  . Ciprofloxacin Rash  . Sulfonamide Derivatives Rash   Current Outpatient Medications on File Prior to Visit   Medication Sig Dispense Refill  . hydrocortisone (ANUSOL-HC) 2.5 % rectal cream Place 1 application rectally 2 (two) times daily. 30 g 0  . metoprolol tartrate (LOPRESSOR) 25 MG tablet TAKE 1 TABLET(25 MG) BY MOUTH DAILY 90 tablet 3   No current facility-administered medications on file prior to visit.     Observations/Objective: Alert, NAD, appropriate mood and affect, resps normal, cn 2-12 intact, moves all 4s, no visible rash or swelling Lab Results  Component Value Date   WBC 8.9 07/15/2017   HGB 13.3 07/15/2017   HCT 39.2 07/15/2017   PLT 346.0 07/15/2017   GLUCOSE 109 (H) 07/15/2017   CHOL 190 07/15/2017   TRIG 92.0 07/15/2017   HDL 53.50 07/15/2017   LDLDIRECT 142.5 09/01/2006   LDLCALC 119 (H) 07/15/2017   ALT 20 07/15/2017   AST 19 07/15/2017   NA 139 07/15/2017   K 4.1 07/15/2017   CL 104 07/15/2017   CREATININE 0.53 07/15/2017   BUN 10 07/15/2017   CO2 29 07/15/2017   TSH 1.86 07/15/2017   HGBA1C 6.3 07/15/2017   Assessment and Plan: See notes  Follow Up Instructions: See notes   I discussed the assessment and treatment plan with the patient. The patient was provided an opportunity to ask questions and all were answered. The patient agreed with the plan and demonstrated an understanding of the instructions.   The patient was advised to call back or seek an in-person evaluation if the symptoms worsen or if the condition fails to improve as anticipated   Jeneen Rinks  Jenny Reichmann, MD

## 2018-09-18 ENCOUNTER — Other Ambulatory Visit: Payer: Self-pay | Admitting: Internal Medicine

## 2018-10-13 ENCOUNTER — Encounter: Payer: Self-pay | Admitting: Gynecology

## 2018-10-20 ENCOUNTER — Encounter: Payer: BC Managed Care – PPO | Admitting: Obstetrics & Gynecology

## 2018-10-22 ENCOUNTER — Other Ambulatory Visit: Payer: Self-pay

## 2018-10-22 ENCOUNTER — Ambulatory Visit: Payer: BC Managed Care – PPO | Admitting: Obstetrics & Gynecology

## 2018-10-22 ENCOUNTER — Encounter: Payer: Self-pay | Admitting: Obstetrics & Gynecology

## 2018-10-22 VITALS — BP 128/86 | Ht 59.75 in | Wt 194.0 lb

## 2018-10-22 DIAGNOSIS — Z23 Encounter for immunization: Secondary | ICD-10-CM | POA: Diagnosis not present

## 2018-10-22 DIAGNOSIS — N951 Menopausal and female climacteric states: Secondary | ICD-10-CM | POA: Diagnosis not present

## 2018-10-22 DIAGNOSIS — Z6838 Body mass index (BMI) 38.0-38.9, adult: Secondary | ICD-10-CM

## 2018-10-22 DIAGNOSIS — E6609 Other obesity due to excess calories: Secondary | ICD-10-CM

## 2018-10-22 DIAGNOSIS — Z789 Other specified health status: Secondary | ICD-10-CM | POA: Diagnosis not present

## 2018-10-22 DIAGNOSIS — Z01419 Encounter for gynecological examination (general) (routine) without abnormal findings: Secondary | ICD-10-CM | POA: Diagnosis not present

## 2018-10-22 NOTE — Progress Notes (Signed)
[name] April 05, 1970 [id number]   History:    48 y.o. [place].  Children doing well.  RP:  Established patient presenting for annual gyn exam   HPI:  Oligomenorrhea with no menses x 07/22/2018.  No BTB.  No pelvic pain.  Intermittent mild hot flushes and night sweats.  Abstinent as fiance has ED.  Urine/BMs normal.  Breasts normal.  BMI 38.21.  Walking.  Will schedule Health labs with Fam MD.  Past medical history,surgical history, family history and social history were all reviewed and documented in the EPIC chart.  Gynecologic History Patient's last menstrual period was 07/22/2018. Contraception: abstinence Last Pap: 08/2016. Results were: Negative/HPV HR neg Last mammogram: 01/2018. Results were: Negative Bone Density: Never Colonoscopy: Never  Obstetric History OB History  Gravida Para Term Preterm AB Living  2 2       2   SAB TAB Ectopic Multiple Live Births               # Outcome Date GA Lbr Len/2nd Weight Sex Delivery Anes PTL Lv  2 Para     F CS-Unspec     1 Para     M CS-Unspec        ROS: A ROS was performed and pertinent positives and negatives are included in the history.  GENERAL: No fevers or chills. HEENT: No change in vision, no earache, sore throat or sinus congestion. NECK: No pain or stiffness. CARDIOVASCULAR: No chest pain or pressure. No palpitations. PULMONARY: No shortness of breath, cough or wheeze. GASTROINTESTINAL: No abdominal pain, nausea, vomiting or diarrhea, melena or bright red blood per rectum. GENITOURINARY: No urinary frequency, urgency, hesitancy or dysuria. MUSCULOSKELETAL: No joint or muscle pain, no back pain, no recent trauma. DERMATOLOGIC: No rash, no itching, no lesions. ENDOCRINE: No polyuria, polydipsia, no heat or cold intolerance. No recent change in weight. HEMATOLOGICAL: No anemia or easy bruising or bleeding. NEUROLOGIC: No headache, seizures, numbness, tingling or weakness. PSYCHIATRIC: No depression, no loss of interest  in normal activity or change in sleep pattern.     Exam:   BP 128/86   Ht 4' 11.75" (1.518 m)   Wt 194 lb (88 kg)   LMP 07/22/2018   BMI 38.21 kg/m   Body mass index is 38.21 kg/m.  General appearance : Well developed well nourished female. No acute distress HEENT: Eyes: no retinal hemorrhage or exudates,  Neck supple, trachea midline, no carotid bruits, no thyroidmegaly Lungs: Clear to auscultation, no rhonchi or wheezes, or rib retractions  Heart: Regular rate and rhythm, no murmurs or gallops Breast:Examined in sitting and supine position were symmetrical in appearance, no palpable masses or tenderness,  no skin retraction, no nipple inversion, no nipple discharge, no skin discoloration, no axillary or supraclavicular lymphadenopathy Abdomen: no palpable masses or tenderness, no rebound or guarding Extremities: no edema or skin discoloration or tenderness  Pelvic: Vulva: Normal             Vagina: No gross lesions or discharge  Cervix: No gross lesions or discharge.  Pap reflex done.  Uterus  AV, normal size, shape and consistency, non-tender and mobile  Adnexa  Without masses or tenderness  Anus: Normal   Assessment/Plan:  48 y.o. female for annual exam   1. Encounter for routine gynecological examination with Papanicolaou smear of cervix Normal gynecologic exam.  Pap reflex done.  Breast exam normal.  Last screening mammogram January 2020 was negative.  Health labs with family physician.  2. Uses abstinence for birth control  3. Perimenopause Oligomenorrhea with no menses since July 22, 2018.  Probable perimenopause, with mild hot flushes and night sweats.  Precautions discussed concerning irregular and heavy bleeding during the perimenopausal phase.  Patient will call for investigation with a pelvic ultrasound if develops abnormal vaginal bleeding and will consider progestin therapy.  4. Class 2 obesity due to excess calories without serious comorbidity with body mass  index (BMI) of 38.0 to 38.9 in adult Recommend a lower calorie/carb diet such as [employer].  Aerobic physical activities 5 times a week and weightlifting every 2 days.  5. Flu vaccine need Flu shot given today.  Other orders - Flu Vaccine QUAD 36+ mos IM (Fluarix, Quad PF)  Princess Bruins MD, 4:09 PM 10/22/2018

## 2018-10-23 ENCOUNTER — Encounter: Payer: Self-pay | Admitting: Obstetrics & Gynecology

## 2018-10-23 ENCOUNTER — Encounter: Payer: Self-pay | Admitting: Internal Medicine

## 2018-10-23 NOTE — Patient Instructions (Signed)
1. Encounter for routine gynecological examination with Papanicolaou smear of cervix Normal gynecologic exam.  Pap reflex done.  Breast exam normal.  Last screening mammogram January 2020 was negative.  Health labs with family physician.  2. Uses abstinence for birth control  3. Perimenopause Oligomenorrhea with no menses since July 22, 2018.  Probable perimenopause, with mild hot flushes and night sweats.  Precautions discussed concerning irregular and heavy bleeding during the perimenopausal phase.  Patient will call for investigation with a pelvic ultrasound if develops abnormal vaginal bleeding and will consider progestin therapy.  4. Class 2 obesity due to excess calories without serious comorbidity with body mass index (BMI) of 38.0 to 38.9 in adult Recommend a lower calorie/carb diet such as [employer].  Aerobic physical activities 5 times a week and weightlifting every 2 days.  5. Flu vaccine need Flu shot given today.  Other orders - Flu Vaccine QUAD 36+ mos IM (Fluarix, Quad PF)  Mayia, it was a pleasure seeing you today!  I will inform you of your results as soon as they are available.

## 2018-10-23 NOTE — Addendum Note (Signed)
Addended by: Thurnell Garbe A on: 10/23/2018 08:42 AM   Modules accepted: Orders

## 2018-10-27 LAB — PAP IG W/ RFLX HPV ASCU

## 2018-11-11 ENCOUNTER — Encounter: Payer: BC Managed Care – PPO | Admitting: Internal Medicine

## 2018-12-02 ENCOUNTER — Other Ambulatory Visit (INDEPENDENT_AMBULATORY_CARE_PROVIDER_SITE_OTHER): Payer: BC Managed Care – PPO

## 2018-12-02 ENCOUNTER — Other Ambulatory Visit: Payer: Self-pay

## 2018-12-02 ENCOUNTER — Encounter: Payer: Self-pay | Admitting: Internal Medicine

## 2018-12-02 ENCOUNTER — Ambulatory Visit (INDEPENDENT_AMBULATORY_CARE_PROVIDER_SITE_OTHER): Payer: BC Managed Care – PPO | Admitting: Internal Medicine

## 2018-12-02 VITALS — BP 124/82 | HR 92 | Temp 97.9°F | Ht 59.25 in | Wt 193.0 lb

## 2018-12-02 DIAGNOSIS — E611 Iron deficiency: Secondary | ICD-10-CM | POA: Diagnosis not present

## 2018-12-02 DIAGNOSIS — R7302 Impaired glucose tolerance (oral): Secondary | ICD-10-CM

## 2018-12-02 DIAGNOSIS — Z0000 Encounter for general adult medical examination without abnormal findings: Secondary | ICD-10-CM

## 2018-12-02 DIAGNOSIS — I10 Essential (primary) hypertension: Secondary | ICD-10-CM

## 2018-12-02 DIAGNOSIS — E538 Deficiency of other specified B group vitamins: Secondary | ICD-10-CM

## 2018-12-02 DIAGNOSIS — E559 Vitamin D deficiency, unspecified: Secondary | ICD-10-CM

## 2018-12-02 DIAGNOSIS — E785 Hyperlipidemia, unspecified: Secondary | ICD-10-CM

## 2018-12-02 LAB — URINALYSIS, ROUTINE W REFLEX MICROSCOPIC
Bilirubin Urine: NEGATIVE
Ketones, ur: NEGATIVE
Leukocytes,Ua: NEGATIVE
Nitrite: NEGATIVE
Specific Gravity, Urine: <=1.005 — AB (ref 1.000–1.030)
Total Protein, Urine: NEGATIVE
Urine Glucose: NEGATIVE
Urobilinogen, UA: 0.2 (ref 0.0–1.0)
pH: 7 (ref 5.0–8.0)

## 2018-12-02 LAB — HEPATIC FUNCTION PANEL
ALT: 17 U/L (ref 0–35)
AST: 15 U/L (ref 0–37)
Albumin: 4.2 g/dL (ref 3.5–5.2)
Alkaline Phosphatase: 49 U/L (ref 39–117)
Bilirubin, Direct: 0.1 mg/dL (ref 0.0–0.3)
Total Bilirubin: 0.3 mg/dL (ref 0.2–1.2)
Total Protein: 7.1 g/dL (ref 6.0–8.3)

## 2018-12-02 LAB — CBC WITH DIFFERENTIAL/PLATELET
Basophils Absolute: 0.1 10*3/uL (ref 0.0–0.1)
Basophils Relative: 0.9 % (ref 0.0–3.0)
Eosinophils Absolute: 0.6 10*3/uL (ref 0.0–0.7)
Eosinophils Relative: 5.3 % — ABNORMAL HIGH (ref 0.0–5.0)
HCT: 39 % (ref 36.0–46.0)
Hemoglobin: 13.4 g/dL (ref 12.0–15.0)
Lymphocytes Relative: 20.7 % (ref 12.0–46.0)
Lymphs Abs: 2.2 10*3/uL (ref 0.7–4.0)
MCHC: 34.2 g/dL (ref 30.0–36.0)
MCV: 88.3 fl (ref 78.0–100.0)
Monocytes Absolute: 0.8 10*3/uL (ref 0.1–1.0)
Monocytes Relative: 7 % (ref 3.0–12.0)
Neutro Abs: 7.1 10*3/uL (ref 1.4–7.7)
Neutrophils Relative %: 66.1 % (ref 43.0–77.0)
Platelets: 304 10*3/uL (ref 150.0–400.0)
RBC: 4.42 Mil/uL (ref 3.87–5.11)
RDW: 12.7 % (ref 11.5–15.5)
WBC: 10.7 10*3/uL — ABNORMAL HIGH (ref 4.0–10.5)

## 2018-12-02 LAB — IBC PANEL
Iron: 65 ug/dL (ref 42–145)
Saturation Ratios: 18.7 % — ABNORMAL LOW (ref 20.0–50.0)
Transferrin: 248 mg/dL (ref 212.0–360.0)

## 2018-12-02 LAB — LIPID PANEL
Cholesterol: 192 mg/dL (ref 0–200)
HDL: 52.4 mg/dL (ref >39.00)
LDL Cholesterol: 121 mg/dL — ABNORMAL HIGH (ref 0–99)
NonHDL: 139.48
Total CHOL/HDL Ratio: 4
Triglycerides: 91 mg/dL (ref 0.0–149.0)
VLDL: 18.2 mg/dL (ref 0.0–40.0)

## 2018-12-02 LAB — TSH: TSH: 2.49 u[IU]/mL (ref 0.35–4.50)

## 2018-12-02 LAB — BASIC METABOLIC PANEL
BUN: 9 mg/dL (ref 6–23)
CO2: 29 mEq/L (ref 19–32)
Calcium: 8.9 mg/dL (ref 8.4–10.5)
Chloride: 102 mEq/L (ref 96–112)
Creatinine, Ser: 0.58 mg/dL (ref 0.40–1.20)
GFR: 110.8 mL/min (ref >60.00)
Glucose, Bld: 100 mg/dL — ABNORMAL HIGH (ref 70–99)
Potassium: 3.4 mEq/L — ABNORMAL LOW (ref 3.5–5.1)
Sodium: 139 mEq/L (ref 135–145)

## 2018-12-02 LAB — VITAMIN B12: Vitamin B-12: 403 pg/mL (ref 211–911)

## 2018-12-02 LAB — HEMOGLOBIN A1C: Hgb A1c MFr Bld: 6.2 % (ref 4.6–6.5)

## 2018-12-02 LAB — VITAMIN D 25 HYDROXY (VIT D DEFICIENCY, FRACTURES): VITD: 28.11 ng/mL — ABNORMAL LOW (ref 30.00–100.00)

## 2018-12-02 MED ORDER — METOPROLOL TARTRATE 25 MG PO TABS: ORAL_TABLET | ORAL | 1 refills | Status: DC

## 2018-12-02 NOTE — Assessment & Plan Note (Signed)
stable overall by history and exam, recent data reviewed with pt, and pt to continue medical treatment as before,  to f/u any worsening symptoms or concerns  

## 2018-12-02 NOTE — Assessment & Plan Note (Signed)

## 2018-12-02 NOTE — Assessment & Plan Note (Signed)
Declined statin as recommended last yr, cont lower chol diet

## 2018-12-02 NOTE — Progress Notes (Signed)
Subjective:    Patient ID: [name], female    DOB: 1970/07/02, 48 y.o.   MRN: [id number]  HPI  Here for wellness and f/u;  Overall doing ok;  Pt denies Chest pain, worsening SOB, DOE, wheezing, orthopnea, PND, worsening LE edema, palpitations, dizziness or syncope.  Pt denies neurological change such as new headache, facial or extremity weakness.  Pt denies polydipsia, polyuria, or low sugar symptoms. Pt states overall good compliance with treatment and medications, good tolerability, and has been trying to follow appropriate diet.  Pt denies worsening depressive symptoms, suicidal ideation or panic. No fever, night sweats, wt loss, loss of appetite, or other constitutional symptoms.  Pt states good ability with ADL's, has low fall risk, home safety reviewed and adequate, no other significant changes in hearing or vision, and only occasionally active with exercise. Due for colonoscopy.  No new complaints Past Medical History:  Diagnosis Date  . ALLERGIC RHINITIS   . ANXIETY   . GERD   . Gestational diabetes   . HYPERLIPIDEMIA   . Impaired glucose tolerance   . Tachycardia    Past Surgical History:  Procedure Laterality Date  . CESAREAN SECTION  2003/2008   x 2  . NASAL SINUS SURGERY  1993  . TONSILLECTOMY AND ADENOIDECTOMY      reports that she has never smoked. She has never used smokeless tobacco. She reports that she does not drink alcohol or use drugs. family history includes Atrial fibrillation in her mother; Breast cancer (age of onset: 42) in her mother; Colon polyps in her father and mother; Diabetes in her father; Heart disease in her father; Liver disease in her father; Parkinson's disease in her mother. Allergies  Allergen Reactions  . Ciprofloxacin Rash  . Sulfonamide Derivatives Rash   No current outpatient medications on file prior to visit.   No current facility-administered medications on file prior to visit.    Review of Systems Constitutional: Negative for  other unusual diaphoresis, sweats, appetite or weight changes HENT: Negative for other worsening hearing loss, ear pain, facial swelling, mouth sores or neck stiffness.   Eyes: Negative for other worsening pain, redness or other visual disturbance.  Respiratory: Negative for other stridor or swelling Cardiovascular: Negative for other palpitations or other chest pain  Gastrointestinal: Negative for worsening diarrhea or loose stools, blood in stool, distention or other pain Genitourinary: Negative for hematuria, flank pain or other change in urine volume.  Musculoskeletal: Negative for myalgias or other joint swelling.  Skin: Negative for other color change, or other wound or worsening drainage.  Neurological: Negative for other syncope or numbness. Hematological: Negative for other adenopathy or swelling Psychiatric/Behavioral: Negative for hallucinations, other worsening agitation, SI, self-injury, or new decreased concentration All otherwise neg per pt     Objective:   Physical Exam BP 124/82   Pulse 92   Temp 97.9 F (36.6 C) (Oral)   Ht 4' 11.25" (1.505 m)   Wt 193 lb (87.5 kg)   SpO2 98%   BMI 38.65 kg/m  VS noted,  Constitutional: Pt is oriented to person, place, and time. Appears well-developed and well-nourished, in no significant distress and comfortable Head: Normocephalic and atraumatic  Eyes: Conjunctivae and EOM are normal. Pupils are equal, round, and reactive to light Right Ear: External ear normal without discharge Left Ear: External ear normal without discharge Nose: Nose without discharge or deformity Mouth/Throat: Oropharynx is without other ulcerations and moist  Neck: Normal range of motion. Neck supple. No  JVD present. No tracheal deviation present or significant neck LA or mass Cardiovascular: Normal rate, regular rhythm, normal heart sounds and intact distal pulses.   Pulmonary/Chest: WOB normal and breath sounds without rales or wheezing  Abdominal:  Soft. Bowel sounds are normal. NT. No HSM  Musculoskeletal: Normal range of motion. Exhibits no edema Lymphadenopathy: Has no other cervical adenopathy.  Neurological: Pt is alert and oriented to person, place, and time. Pt has normal reflexes. No cranial nerve deficit. Motor grossly intact, Gait intact Skin: Skin is warm and dry. No rash noted or new ulcerations Psychiatric:  Has normal mood and affect. Behavior is normal without agitation All otherwise neg per pt Lab Results  Component Value Date   WBC 10.7 (H) 12/02/2018   HGB 13.4 12/02/2018   HCT 39.0 12/02/2018   PLT 304.0 12/02/2018   GLUCOSE 100 (H) 12/02/2018   CHOL 192 12/02/2018   TRIG 91.0 12/02/2018   HDL 52.40 12/02/2018   LDLDIRECT 142.5 09/01/2006   LDLCALC 121 (H) 12/02/2018   ALT 17 12/02/2018   AST 15 12/02/2018   NA 139 12/02/2018   K 3.4 (L) 12/02/2018   CL 102 12/02/2018   CREATININE 0.58 12/02/2018   BUN 9 12/02/2018   CO2 29 12/02/2018   TSH 2.49 12/02/2018   HGBA1C 6.2 12/02/2018         Assessment & Plan:

## 2018-12-02 NOTE — Patient Instructions (Signed)
You had the Tdap tetanus shot otday  You will be contacted regarding the referral for: colonoscopy  Please continue all other medications as before, and refills have been done if requested.  Please have the pharmacy call with any other refills you may need.  Please continue your efforts at being more active, low cholesterol diet, and weight control.  You are otherwise up to date with prevention measures today.  Please keep your appointments with your specialists as you may have planned  Please go to the LAB in the Basement (turn left off the elevator) for the tests to be done today  You will be contacted by phone if any changes need to be made immediately.  Otherwise, you will receive a letter about your results with an explanation, but please check with MyChart first.  Please remember to sign up for MyChart if you have not done so, as this will be important to you in the future with finding out test results, communicating by private email, and scheduling acute appointments online when needed.  Please return in 1 year for your yearly visit, or sooner if needed, with Lab testing done 3-5 days before

## 2018-12-03 ENCOUNTER — Other Ambulatory Visit: Payer: Self-pay | Admitting: Internal Medicine

## 2018-12-03 MED ORDER — VITAMIN D (ERGOCALCIFEROL) 1.25 MG (50000 UNIT) PO CAPS: 50000.0000 [IU] | ORAL_CAPSULE | ORAL | 0 refills | Status: DC

## 2018-12-04 ENCOUNTER — Encounter: Payer: Self-pay | Admitting: Internal Medicine

## 2018-12-04 DIAGNOSIS — D219 Benign neoplasm of connective and other soft tissue, unspecified: Secondary | ICD-10-CM

## 2018-12-04 NOTE — Telephone Encounter (Signed)
Spoke with patient appointment scheduled for December 10.

## 2018-12-04 NOTE — Telephone Encounter (Signed)
[name] placed order for ultrasound.

## 2018-12-04 NOTE — Telephone Encounter (Signed)
Dr.Lavoie patient had Gyn exam on 10/22/18

## 2018-12-08 ENCOUNTER — Encounter: Payer: Self-pay | Admitting: Gastroenterology

## 2018-12-21 ENCOUNTER — Ambulatory Visit (AMBULATORY_SURGERY_CENTER): Payer: BC Managed Care – PPO | Admitting: *Deleted

## 2018-12-21 ENCOUNTER — Encounter: Payer: Self-pay | Admitting: Gastroenterology

## 2018-12-21 ENCOUNTER — Other Ambulatory Visit: Payer: Self-pay

## 2018-12-21 VITALS — Temp 98.1°F | Ht 59.25 in | Wt 193.0 lb

## 2018-12-21 DIAGNOSIS — Z1211 Encounter for screening for malignant neoplasm of colon: Secondary | ICD-10-CM

## 2018-12-21 DIAGNOSIS — Z1159 Encounter for screening for other viral diseases: Secondary | ICD-10-CM

## 2018-12-21 MED ORDER — NA SULFATE-K SULFATE-MG SULF 17.5-3.13-1.6 GM/177ML PO SOLN: ORAL | 0 refills | Status: DC

## 2018-12-21 NOTE — Progress Notes (Signed)
Patient is here in-person for PV. Patient denies any allergies to eggs or soy. Patient denies any problems with anesthesia/sedation. Patient had heart racing after nasal surgery only. Patient denies any oxygen use at home. Patient denies taking any diet/weight loss medications or blood thinners. Patient is not being treated for MRSA or C-diff. EMMI education assisgned to the patient for the procedure, this was explained and instructions given to patient. COVID-19 screening test is on 12/9 Wed., the pt is aware. Pt is aware that care partner will wait in the car during procedure; if they feel like they will be too hot or cold to wait in the car; they may wait in the 4 th floor lobby. Patient is aware to bring only one care partner. We want them to wear a mask (we do not have any that we can provide them), practice social distancing, and we will check their temperatures when they get here.  I did remind the patient that their care partner needs to stay in the parking lot the entire time and have a cell phone available, we will call them when the pt is ready for discharge. Patient will wear mask into building.    Suprep $15 off coupon given to the patient.

## 2018-12-30 ENCOUNTER — Other Ambulatory Visit: Payer: Self-pay | Admitting: Gastroenterology

## 2018-12-30 ENCOUNTER — Ambulatory Visit (INDEPENDENT_AMBULATORY_CARE_PROVIDER_SITE_OTHER): Payer: BC Managed Care – PPO

## 2018-12-30 DIAGNOSIS — Z1159 Encounter for screening for other viral diseases: Secondary | ICD-10-CM

## 2018-12-31 ENCOUNTER — Ambulatory Visit (INDEPENDENT_AMBULATORY_CARE_PROVIDER_SITE_OTHER): Payer: BC Managed Care – PPO

## 2018-12-31 ENCOUNTER — Other Ambulatory Visit: Payer: Self-pay

## 2018-12-31 ENCOUNTER — Ambulatory Visit: Payer: BC Managed Care – PPO | Admitting: Obstetrics & Gynecology

## 2018-12-31 ENCOUNTER — Encounter: Payer: Self-pay | Admitting: Obstetrics & Gynecology

## 2018-12-31 DIAGNOSIS — Z711 Person with feared health complaint in whom no diagnosis is made: Secondary | ICD-10-CM

## 2018-12-31 DIAGNOSIS — N854 Malposition of uterus: Secondary | ICD-10-CM | POA: Diagnosis not present

## 2018-12-31 DIAGNOSIS — N8301 Follicular cyst of right ovary: Secondary | ICD-10-CM

## 2018-12-31 DIAGNOSIS — D219 Benign neoplasm of connective and other soft tissue, unspecified: Secondary | ICD-10-CM | POA: Diagnosis not present

## 2018-12-31 DIAGNOSIS — N83201 Unspecified ovarian cyst, right side: Secondary | ICD-10-CM

## 2018-12-31 LAB — SARS CORONAVIRUS 2 (TAT 6-24 HRS): SARS Coronavirus 2: NEGATIVE

## 2018-12-31 NOTE — Patient Instructions (Signed)
1. Worried well Possible lower abdominal mass felt by PCP at recent abdominal exam.  Patient is asymptomatic.  Pelvic ultrasound within normal limits, reviewed with patient and patient reassured.  The uterus is anteverted but retroflexed and normal in volume.  No myometrial mass.  The endometrial lining is symmetrical and thin at 2.24 mm.  The left ovary is normal.  The right ovary shows a very small benign appearing simple follicle and a 0.9 cm solid nodule which is avascular.  No free fluid in the pelvis.  Patient will have a screening colonoscopy.  [name], it was a pleasure seeing you today!

## 2018-12-31 NOTE — Progress Notes (Signed)
    TEMPIE DELAPUENTE 19-Jun-1970 [id number]        48 y.o.  [place].   RP: Possible lower abdominal mass felt by PCP for Pelvic US  HPI: Normal gynecologic exam with me 10/22/18.  Seen by PCP who felt a possible lower abdominal mass on abdominal exam.  No abdomen/pelvic pain.  Oligomenorrhea.  Abstinent.  Urine normal.  BMs normal.  Patient is scheduled for her first screening Colonoscopy.   OB History  Gravida Para Term Preterm AB Living  2 2       2   SAB TAB Ectopic Multiple Live Births               # Outcome Date GA Lbr Len/2nd Weight Sex Delivery Anes PTL Lv  2 Para     F CS-Unspec     1 Para     M CS-Unspec       Past medical history,surgical history, problem list, medications, allergies, family history and social history were all reviewed and documented in the EPIC chart.   Directed ROS with pertinent positives and negatives documented in the history of present illness/assessment and plan.  Exam:  There were no vitals filed for this visit. General appearance:  Normal  Abdomen: Soft, not distended, NT.  No mass felt.  Pelvic US today: T/V images.  Anteverted uterus retroflexed at the C-section scar.  The uterus is normal size with no myometrial mass, measured at 8.47 x 4.97 x 4.33 cm.  The endometrial lining is thin and symmetrical measured at 2.24 mm.  No endometrial mass or thickening seen.  Left ovary is small with no visible follicle.  The right ovary has a single simple follicle measured at 1.2 cm.  A very small avascular solid nodule is present on the right ovary measured at 0.9 x 0.8 cm, compatible with a small fibroma or dermoid.  No free fluid in the posterior cul-de-sac.   Assessment/Plan:  48 y.o. G2P2   1. Worried well Possible lower abdominal mass felt by PCP at recent abdominal exam.  Patient is asymptomatic.  Pelvic ultrasound within normal limits, reviewed with patient and patient reassured.  The uterus is anteverted but retroflexed and normal in  volume.  No myometrial mass.  The endometrial lining is symmetrical and thin at 2.24 mm.  The left ovary is normal.  The right ovary shows a very small benign appearing simple follicle and a 0.9 cm solid nodule which is avascular.  No free fluid in the pelvis.  Patient will have a screening colonoscopy.  Counseling on above issues and coordination of care more than 50% for 15 minutes.  Princess Bruins MD, 12:27 PM 12/31/2018

## 2019-01-01 ENCOUNTER — Encounter: Payer: Self-pay | Admitting: Gastroenterology

## 2019-01-01 ENCOUNTER — Ambulatory Visit (AMBULATORY_SURGERY_CENTER): Payer: BC Managed Care – PPO | Admitting: Gastroenterology

## 2019-01-01 VITALS — BP 123/81 | HR 90 | Temp 98.1°F | Resp 13 | Ht 59.25 in | Wt 193.0 lb

## 2019-01-01 DIAGNOSIS — D124 Benign neoplasm of descending colon: Secondary | ICD-10-CM

## 2019-01-01 DIAGNOSIS — Z1211 Encounter for screening for malignant neoplasm of colon: Secondary | ICD-10-CM

## 2019-01-01 MED ORDER — SODIUM CHLORIDE 0.9 % IV SOLN: 500.0000 mL | Freq: Once | INTRAVENOUS | Status: DC

## 2019-01-01 NOTE — Progress Notes (Signed)
PT taken to PACU. Monitors in place. VSS. Report given to RN. 

## 2019-01-01 NOTE — Progress Notes (Signed)
VS- Riki Sheer Temp-Lisa Clapp  Pt's states no medical or surgical changes since previsit or office visit.

## 2019-01-01 NOTE — Patient Instructions (Signed)
Handouts given:  Polyps, Diverticulosis Resume previous diet Continue present medications Await Pathology results    YOU HAD AN ENDOSCOPIC PROCEDURE TODAY AT [facility]:   Refer to the procedure report that was given to you for any specific questions about what was found during the examination.  If the procedure report does not answer your questions, please call your gastroenterologist to clarify.  If you requested that your care partner not be given the details of your procedure findings, then the procedure report has been included in a sealed envelope for you to review at your convenience later.  YOU SHOULD EXPECT: Some feelings of bloating in the abdomen. Passage of more gas than usual.  Walking can help get rid of the air that was put into your GI tract during the procedure and reduce the bloating. If you had a lower endoscopy (such as a colonoscopy or flexible sigmoidoscopy) you may notice spotting of blood in your stool or on the toilet paper. If you underwent a bowel prep for your procedure, you may not have a normal bowel movement for a few days.  Please Note:  You might notice some irritation and congestion in your nose or some drainage.  This is from the oxygen used during your procedure.  There is no need for concern and it should clear up in a day or so.  SYMPTOMS TO REPORT IMMEDIATELY:   Following lower endoscopy (colonoscopy or flexible sigmoidoscopy):  Excessive amounts of blood in the stool  Significant tenderness or worsening of abdominal pains  Swelling of the abdomen that is new, acute  Fever of 100F or higher   For urgent or emergent issues, a gastroenterologist can be reached at any hour by calling [phone].   DIET:  We do recommend a small meal at first, but then you may proceed to your regular diet.  Drink plenty of fluids but you should avoid alcoholic beverages for 24 hours.  ACTIVITY:  You should plan to take it easy for the rest of today  and you should NOT DRIVE or use heavy machinery until tomorrow (because of the sedation medicines used during the test).    FOLLOW UP: Our staff will call the number listed on your records 48-72 hours following your procedure to check on you and address any questions or concerns that you may have regarding the information given to you following your procedure. If we do not reach you, we will leave a message.  We will attempt to reach you two times.  During this call, we will ask if you have developed any symptoms of COVID 19. If you develop any symptoms (ie: fever, flu-like symptoms, shortness of breath, cough etc.) before then, please call [phone].  If you test positive for Covid 19 in the 2 weeks post procedure, please call and report this information to [place].    If any biopsies were taken you will be contacted by phone or by letter within the next 1-3 weeks.  Please call us at [phone] if you have not heard about the biopsies in 3 weeks.    SIGNATURES/CONFIDENTIALITY: You and/or your care partner have signed paperwork which will be entered into your electronic medical record.  These signatures attest to the fact that that the information above on your After Visit Summary has been reviewed and is understood.  Full responsibility of the confidentiality of this discharge information lies with you and/or your care-partner.

## 2019-01-01 NOTE — Progress Notes (Signed)
Called to room to assist during endoscopic procedure.  Patient ID and intended procedure confirmed with present staff. Received instructions for my participation in the procedure from the performing physician.  

## 2019-01-01 NOTE — Op Note (Signed)
[facility] Patient Name: [name] Procedure Date: 01/01/2019 3:05 PM MRN: [id number] Endoscopist: Milus Banister , MD Age: 48 Referring MD:  Date of Birth: October 25, 1970 Gender: Female Account #: [id number] Procedure:                Colonoscopy Indications:              Screening for colorectal malignant neoplasm Medicines:                Monitored Anesthesia Care Procedure:                Pre-Anesthesia Assessment:                           - Prior to the procedure, a History and Physical                            was performed, and patient medications and                            allergies were reviewed. The patient's tolerance of                            previous anesthesia was also reviewed. The risks                            and benefits of the procedure and the sedation                            options and risks were discussed with the patient.                            All questions were answered, and informed consent                            was obtained. Prior Anticoagulants: The patient has                            taken no previous anticoagulant or antiplatelet                            agents. ASA Grade Assessment: II - A patient with                            mild systemic disease. After reviewing the risks                            and benefits, the patient was deemed in                            satisfactory condition to undergo the procedure.                           After obtaining informed consent, the colonoscope  was passed under direct vision. Throughout the                            procedure, the patient's blood pressure, pulse, and                            oxygen saturations were monitored continuously. The                            Colonoscope was introduced through the anus and                            advanced to the the cecum, identified by                            appendiceal orifice and  ileocecal valve. The                            colonoscopy was performed without difficulty. The                            patient tolerated the procedure well. The quality                            of the bowel preparation was good. The ileocecal                            valve, appendiceal orifice, and rectum were                            photographed. Scope In: 3:23:10 PM Scope Out: 3:31:16 PM Scope Withdrawal Time: 0 hours 6 minutes 8 seconds  Total Procedure Duration: 0 hours 8 minutes 6 seconds  Findings:                 A 2 mm polyp was found in the descending colon. The                            polyp was sessile. The polyp was removed with a                            cold snare. Resection and retrieval were complete.                           Multiple small-mouthed diverticula were found in                            the left colon.                           The exam was otherwise without abnormality on                            direct and retroflexion views. Complications:  No immediate complications. Estimated blood loss:                            None. Estimated Blood Loss:     Estimated blood loss: none. Impression:               - One 2 mm polyp in the descending colon, removed                            with a cold snare. Resected and retrieved.                           - Diverticulosis in the left colon.                           - The examination was otherwise normal on direct                            and retroflexion views. Recommendation:           - Patient has a contact number available for                            emergencies. The signs and symptoms of potential                            delayed complications were discussed with the                            patient. Return to normal activities tomorrow.                            Written discharge instructions were provided to the                            patient.                            - Resume previous diet.                           - Continue present medications.                           - Await pathology results. Milus Banister, MD 01/01/2019 3:33:25 PM This report has been signed electronically.

## 2019-01-05 ENCOUNTER — Telehealth: Payer: Self-pay

## 2019-01-05 NOTE — Telephone Encounter (Signed)
  Follow up Call-  Call back number 01/01/2019  Post procedure Call Back phone  # [phone]  Permission to leave phone message Yes  Some recent data might be hidden     Patient questions:  Do you have a fever, pain , or abdominal swelling? No. Pain Score  0 *  Have you tolerated food without any problems? Yes.    Have you been able to return to your normal activities? Yes.    Do you have any questions about your discharge instructions: Diet   No. Medications  No. Follow up visit  No.  Do you have questions or concerns about your Care? No.  Actions: * If pain score is 4 or above: No action needed, pain <4.  1. Have you developed a fever since your procedure? no  2.   Have you had an respiratory symptoms (SOB or cough) since your procedure? no  3.   Have you tested positive for COVID 19 since your procedure no  4.   Have you had any family members/close contacts diagnosed with the COVID 19 since your procedure?  no   If yes to any of these questions please route to Joylene John, RN and Alphonsa Gin, Therapist, sports.

## 2019-01-06 ENCOUNTER — Other Ambulatory Visit: Payer: Self-pay | Admitting: Obstetrics & Gynecology

## 2019-01-06 DIAGNOSIS — Z1231 Encounter for screening mammogram for malignant neoplasm of breast: Secondary | ICD-10-CM

## 2019-01-07 ENCOUNTER — Encounter: Payer: Self-pay | Admitting: Gastroenterology

## 2019-02-24 ENCOUNTER — Other Ambulatory Visit: Payer: Self-pay | Admitting: Internal Medicine

## 2019-02-25 ENCOUNTER — Ambulatory Visit
Admission: RE | Admit: 2019-02-25 | Discharge: 2019-02-25 | Disposition: A | Discharge status: Home / Self Care | Payer: BC Managed Care – PPO | Source: Ambulatory Visit | Attending: Obstetrics & Gynecology | Admitting: Obstetrics & Gynecology

## 2019-02-25 ENCOUNTER — Other Ambulatory Visit: Payer: Self-pay

## 2019-02-25 ENCOUNTER — Other Ambulatory Visit: Payer: Self-pay | Admitting: Obstetrics & Gynecology

## 2019-02-25 DIAGNOSIS — Z1231 Encounter for screening mammogram for malignant neoplasm of breast: Secondary | ICD-10-CM

## 2019-07-18 ENCOUNTER — Other Ambulatory Visit: Payer: Self-pay | Admitting: Internal Medicine

## 2019-07-18 NOTE — Telephone Encounter (Signed)
Please refill as per office routine med refill policy (all routine meds refilled for 3 mo or monthly per pt preference up to one year from last visit, then month to month grace period for 3 mo, then further med refills will have to be denied)  

## 2019-09-15 NOTE — Progress Notes (Signed)
Virtual Visit via Video Note  I connected with [name] on 09/15/19 at 11:15 AM EDT by a video enabled telemedicine application and verified that I am speaking with the correct person using two identifiers.   I discussed the limitations of evaluation and management by telemedicine and the availability of in person appointments. The patient expressed understanding and agreed to proceed.  Present for the visit:  Myself, Dr Billey Gosling, Lenard Galloway.  The patient is currently at home and I am in the office.    No referring provider.    History of Present Illness: She is here for an acute visit for cold symptoms.   She tested negative for covid - rapid test at urgent care Monday was negative.  It was sent out - result still pending.    Her symptoms started the week of 8/9.  She is experiencing low grade fever, fatigue, sweats, congestion, sinus pressure at times, sore throat that is improving, dry cough, headaches and some diarrhea earlier this week.    She has tried taking tylenol and delsym.  She tried claritin initially.   She is sleeping well.  She is eating and drinking well.   She is a Pharmacist, hospital.  She is concerned because the symptoms have been going on so long.    Review of Systems  Constitutional: Positive for diaphoresis, fever (low grade) and malaise/fatigue.  HENT: Positive for congestion (mild at times), sinus pain (with coughing) and sore throat (raidating up to ears - improved).   Respiratory: Positive for cough (dry). Negative for shortness of breath and wheezing.   Gastrointestinal: Positive for diarrhea (occ).  Neurological: Positive for headaches.     Social History   Socioeconomic History  . Marital status: Divorced    Spouse name: Not on file  . Number of children: 2  . Years of education: Not on file  . Highest education level: Not on file  Occupational History  . Occupation: Product manager: [employer]  Tobacco Use  . Smoking  status: Never Smoker  . Smokeless tobacco: Never Used  Vaping Use  . Vaping Use: Never used  Substance and Sexual Activity  . Alcohol use: No    Comment: occasional-2 per month  . Drug use: No  . Sexual activity: Not Currently    Comment: 1ST INTERCOURSE- 26, PARTNERS- 3,   Other Topics Concern  . Not on file  Social History Narrative  . Not on file   Social Determinants of Health   Financial Resource Strain:   . Difficulty of Paying Living Expenses: Not on file  Food Insecurity:   . Worried About Charity fundraiser in the Last Year: Not on file  . Ran Out of Food in the Last Year: Not on file  Transportation Needs:   . Lack of Transportation (Medical): Not on file  . Lack of Transportation (Non-Medical): Not on file  Physical Activity:   . Days of Exercise per Week: Not on file  . Minutes of Exercise per Session: Not on file  Stress:   . Feeling of Stress : Not on file  Social Connections:   . Frequency of Communication with Friends and Family: Not on file  . Frequency of Social Gatherings with Friends and Family: Not on file  . Attends Religious Services: Not on file  . Active Member of Clubs or Organizations: Not on file  . Attends Archivist Meetings: Not on file  . Marital Status: Not  on file     Observations/Objective: Appears well in NAD Breathing normally Skin appears warm and dry  Assessment and Plan:  See Problem List for Assessment and Plan of chronic medical problems.   Follow Up Instructions:    I discussed the assessment and treatment plan with the patient. The patient was provided an opportunity to ask questions and all were answered. The patient agreed with the plan and demonstrated an understanding of the instructions.   The patient was advised to call back or seek an in-person evaluation if the symptoms worsen or if the condition fails to improve as anticipated.    Binnie Rail, MD

## 2019-09-16 ENCOUNTER — Telehealth (INDEPENDENT_AMBULATORY_CARE_PROVIDER_SITE_OTHER): Payer: BC Managed Care – PPO | Admitting: Internal Medicine

## 2019-09-16 ENCOUNTER — Encounter: Payer: Self-pay | Admitting: Internal Medicine

## 2019-09-16 DIAGNOSIS — J019 Acute sinusitis, unspecified: Secondary | ICD-10-CM | POA: Diagnosis not present

## 2019-09-16 MED ORDER — AZITHROMYCIN 250 MG PO TABS: ORAL_TABLET | ORAL | 0 refills | Status: DC

## 2019-09-16 NOTE — Assessment & Plan Note (Signed)
Subacute ? covid vs sinus infection Rapid covid test neg, send away test is still pending Difficult to know for sure, but given neg rapid test will start a zpak She will let me now results of covid tests otc cold meds, rest, fluids

## 2019-11-15 ENCOUNTER — Other Ambulatory Visit: Payer: Self-pay

## 2019-11-15 ENCOUNTER — Encounter: Payer: Self-pay | Admitting: Obstetrics & Gynecology

## 2019-11-15 ENCOUNTER — Ambulatory Visit (INDEPENDENT_AMBULATORY_CARE_PROVIDER_SITE_OTHER): Payer: BC Managed Care – PPO | Admitting: Obstetrics & Gynecology

## 2019-11-15 VITALS — BP 120/80 | Ht 59.0 in | Wt 192.0 lb

## 2019-11-15 DIAGNOSIS — N951 Menopausal and female climacteric states: Secondary | ICD-10-CM

## 2019-11-15 DIAGNOSIS — E6609 Other obesity due to excess calories: Secondary | ICD-10-CM | POA: Diagnosis not present

## 2019-11-15 DIAGNOSIS — Z789 Other specified health status: Secondary | ICD-10-CM | POA: Diagnosis not present

## 2019-11-15 DIAGNOSIS — Z23 Encounter for immunization: Secondary | ICD-10-CM

## 2019-11-15 DIAGNOSIS — Z01419 Encounter for gynecological examination (general) (routine) without abnormal findings: Secondary | ICD-10-CM | POA: Diagnosis not present

## 2019-11-15 DIAGNOSIS — Z6838 Body mass index (BMI) 38.0-38.9, adult: Secondary | ICD-10-CM

## 2019-11-15 NOTE — Progress Notes (Signed)
[name] 1970/09/22 [id number]   History:    49 y.o. G2P2L2 Engaged.  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  Olevia Bowens is in 8th grade.  RP:  Established patient presenting for annual gyn exam   HPI:  Oligomenorrhea about every 2-3 months with LMP x yesterday 11/14/2019 light.  No pelvic pain.  Intermittent mild hot flushes and night sweats.  Abstinent as fiance has ED.  Urine/BMs normal. Breasts normal.  BMI 38.78. Walking.  Will schedule Health labs with Fam MD. Harriet Masson 12/2018 Benign polyp.  Past medical history,surgical history, family history and social history were all reviewed and documented in the EPIC chart.  Gynecologic History Patient's last menstrual period was 11/14/2019.  Obstetric History OB History  Gravida Para Term Preterm AB Living  2 2       2   SAB TAB Ectopic Multiple Live Births               # Outcome Date GA Lbr Len/2nd Weight Sex Delivery Anes PTL Lv  2 Para     F CS-Unspec     1 Para     M CS-Unspec        ROS: A ROS was performed and pertinent positives and negatives are included in the history.  GENERAL: No fevers or chills. HEENT: No change in vision, no earache, sore throat or sinus congestion. NECK: No pain or stiffness. CARDIOVASCULAR: No chest pain or pressure. No palpitations. PULMONARY: No shortness of breath, cough or wheeze. GASTROINTESTINAL: No abdominal pain, nausea, vomiting or diarrhea, melena or bright red blood per rectum. GENITOURINARY: No urinary frequency, urgency, hesitancy or dysuria. MUSCULOSKELETAL: No joint or muscle pain, no back pain, no recent trauma. DERMATOLOGIC: No rash, no itching, no lesions. ENDOCRINE: No polyuria, polydipsia, no heat or cold intolerance. No recent change in weight. HEMATOLOGICAL: No anemia or easy bruising or bleeding. NEUROLOGIC: No headache, seizures, numbness, tingling or weakness. PSYCHIATRIC: No depression, no loss of interest in normal activity or change in sleep  pattern.     Exam:   BP 120/80 (BP Location: Right Arm, Patient Position: Sitting, Cuff Size: Normal)   Ht 4\' 11"  (1.499 m)   Wt 192 lb (87.1 kg)   LMP 11/14/2019   BMI 38.78 kg/m   Body mass index is 38.78 kg/m.  General appearance : Well developed well nourished female. No acute distress HEENT: Eyes: no retinal hemorrhage or exudates,  Neck supple, trachea midline, no carotid bruits, no thyroidmegaly Lungs: Clear to auscultation, no rhonchi or wheezes, or rib retractions  Heart: Regular rate and rhythm, no murmurs or gallops Breast:Examined in sitting and supine position were symmetrical in appearance, no palpable masses or tenderness,  no skin retraction, no nipple inversion, no nipple discharge, no skin discoloration, no axillary or supraclavicular lymphadenopathy Abdomen: no palpable masses or tenderness, no rebound or guarding Extremities: no edema or skin discoloration or tenderness  Pelvic: Vulva: Normal             Vagina: No gross lesions or discharge  Cervix: No gross lesions or discharge.  Pap reflex done.  Uterus  RV, normal size, shape and consistency, non-tender and mobile  Adnexa  Without masses or tenderness  Anus: Normal   Assessment/Plan:  49 y.o. female for annual exam   1. Encounter for routine gynecological examination with Papanicolaou smear of cervix Normal gynecologic exam.  Pap reflex done.  Breast exam normal.  Screening mammogram February 2021 was negative.  Colonoscopy  December 2020.  Health labs with family physician.  2. Uses abstinence for birth control  3. Perimenopause Oligomenorrhea with periods every 2 to 3 months, flow within normal limits.  No breakthrough bleeding.  Will call for evaluation if abnormal bleeding.  4. Class 2 obesity due to excess calories without serious comorbidity with body mass index (BMI) of 38.0 to 38.9 in adult Recommend a lower calorie/carb diet.  Aerobic activities 5 times a week and light weightlifting every 2  days.  5. Need for immunization against influenza - Flu Vaccine QUAD 36+ mos IM  Other orders - Multiple Vitamin (MULTIVITAMIN) capsule; Take 1 capsule by mouth daily. - methylPREDNISolone (MEDROL DOSEPAK) 4 MG TBPK tablet; Take by mouth as directed. - promethazine-dextromethorphan (PROMETHAZINE-DM) 6.25-15 MG/5ML syrup; Take 5 mLs by mouth every 4 (four) hours as needed.  Princess Bruins MD, 4:03 PM 11/15/2019

## 2019-11-17 LAB — PAP IG W/ RFLX HPV ASCU

## 2020-01-18 ENCOUNTER — Other Ambulatory Visit: Payer: Self-pay | Admitting: Obstetrics & Gynecology

## 2020-01-18 DIAGNOSIS — Z1231 Encounter for screening mammogram for malignant neoplasm of breast: Secondary | ICD-10-CM

## 2020-02-15 ENCOUNTER — Other Ambulatory Visit: Payer: Self-pay | Admitting: Internal Medicine

## 2020-02-15 NOTE — Telephone Encounter (Signed)
Please refill as per office routine med refill policy (all routine meds refilled for 3 mo or monthly per pt preference up to one year from last visit, then month to month grace period for 3 mo, then further med refills will have to be denied)  

## 2020-03-01 ENCOUNTER — Other Ambulatory Visit: Payer: Self-pay

## 2020-03-01 ENCOUNTER — Ambulatory Visit
Admission: RE | Admit: 2020-03-01 | Discharge: 2020-03-01 | Disposition: A | Discharge status: Home / Self Care | Payer: BC Managed Care – PPO | Source: Ambulatory Visit | Attending: Obstetrics & Gynecology | Admitting: Obstetrics & Gynecology

## 2020-03-01 DIAGNOSIS — Z1231 Encounter for screening mammogram for malignant neoplasm of breast: Secondary | ICD-10-CM

## 2020-03-13 ENCOUNTER — Encounter: Payer: Self-pay | Admitting: Internal Medicine

## 2020-03-13 ENCOUNTER — Ambulatory Visit (INDEPENDENT_AMBULATORY_CARE_PROVIDER_SITE_OTHER): Payer: BC Managed Care – PPO | Admitting: Internal Medicine

## 2020-03-13 ENCOUNTER — Other Ambulatory Visit: Payer: Self-pay

## 2020-03-13 VITALS — BP 122/74 | HR 94 | Temp 98.3°F | Ht 59.0 in | Wt 195.0 lb

## 2020-03-13 DIAGNOSIS — Z0000 Encounter for general adult medical examination without abnormal findings: Secondary | ICD-10-CM

## 2020-03-13 DIAGNOSIS — R7302 Impaired glucose tolerance (oral): Secondary | ICD-10-CM | POA: Diagnosis not present

## 2020-03-13 DIAGNOSIS — E785 Hyperlipidemia, unspecified: Secondary | ICD-10-CM

## 2020-03-13 DIAGNOSIS — E559 Vitamin D deficiency, unspecified: Secondary | ICD-10-CM

## 2020-03-13 DIAGNOSIS — I10 Essential (primary) hypertension: Secondary | ICD-10-CM | POA: Diagnosis not present

## 2020-03-13 DIAGNOSIS — Z0001 Encounter for general adult medical examination with abnormal findings: Secondary | ICD-10-CM

## 2020-03-13 DIAGNOSIS — Z23 Encounter for immunization: Secondary | ICD-10-CM

## 2020-03-13 LAB — CBC WITH DIFFERENTIAL/PLATELET
Basophils Absolute: 0.1 10*3/uL (ref 0.0–0.1)
Basophils Relative: 1 % (ref 0.0–3.0)
Eosinophils Absolute: 0.4 10*3/uL (ref 0.0–0.7)
Eosinophils Relative: 3.5 % (ref 0.0–5.0)
HCT: 38.8 % (ref 36.0–46.0)
Hemoglobin: 13.4 g/dL (ref 12.0–15.0)
Lymphocytes Relative: 16.8 % (ref 12.0–46.0)
Lymphs Abs: 1.8 10*3/uL (ref 0.7–4.0)
MCHC: 34.6 g/dL (ref 30.0–36.0)
MCV: 87.3 fl (ref 78.0–100.0)
Monocytes Absolute: 0.7 10*3/uL (ref 0.1–1.0)
Monocytes Relative: 6.3 % (ref 3.0–12.0)
Neutro Abs: 7.9 10*3/uL — ABNORMAL HIGH (ref 1.4–7.7)
Neutrophils Relative %: 72.4 % (ref 43.0–77.0)
Platelets: 373 10*3/uL (ref 150.0–400.0)
RBC: 4.45 Mil/uL (ref 3.87–5.11)
RDW: 13.2 % (ref 11.5–15.5)
WBC: 10.9 10*3/uL — ABNORMAL HIGH (ref 4.0–10.5)

## 2020-03-13 LAB — URINALYSIS, ROUTINE W REFLEX MICROSCOPIC
Bilirubin Urine: NEGATIVE
Ketones, ur: NEGATIVE
Leukocytes,Ua: NEGATIVE
Nitrite: NEGATIVE
Specific Gravity, Urine: 1.015 (ref 1.000–1.030)
Total Protein, Urine: NEGATIVE
Urine Glucose: NEGATIVE
Urobilinogen, UA: 0.2 (ref 0.0–1.0)
pH: 7 (ref 5.0–8.0)

## 2020-03-13 LAB — HEPATIC FUNCTION PANEL
ALT: 18 U/L (ref 0–35)
AST: 17 U/L (ref 0–37)
Albumin: 4.1 g/dL (ref 3.5–5.2)
Alkaline Phosphatase: 56 U/L (ref 39–117)
Bilirubin, Direct: 0.1 mg/dL (ref 0.0–0.3)
Total Bilirubin: 0.4 mg/dL (ref 0.2–1.2)
Total Protein: 6.6 g/dL (ref 6.0–8.3)

## 2020-03-13 LAB — TSH: TSH: 2.01 u[IU]/mL (ref 0.35–4.50)

## 2020-03-13 LAB — LIPID PANEL
Cholesterol: 201 mg/dL — ABNORMAL HIGH (ref 0–200)
HDL: 55.4 mg/dL (ref >39.00)
LDL Cholesterol: 129 mg/dL — ABNORMAL HIGH (ref 0–99)
NonHDL: 145.17
Total CHOL/HDL Ratio: 4
Triglycerides: 79 mg/dL (ref 0.0–149.0)
VLDL: 15.8 mg/dL (ref 0.0–40.0)

## 2020-03-13 LAB — VITAMIN D 25 HYDROXY (VIT D DEFICIENCY, FRACTURES): VITD: 24.58 ng/mL — ABNORMAL LOW (ref 30.00–100.00)

## 2020-03-13 LAB — HEMOGLOBIN A1C: Hgb A1c MFr Bld: 6.4 % (ref 4.6–6.5)

## 2020-03-13 MED ORDER — METOPROLOL TARTRATE 25 MG PO TABS
ORAL_TABLET | ORAL | 3 refills | Status: DC
Start: 2020-03-13 — End: 2021-06-08

## 2020-03-13 NOTE — Assessment & Plan Note (Addendum)
Lab Results  Component Value Date   LDLCALC 121 (H) 12/02/2018   Stable, pt to continue current low chol diet, declines statin

## 2020-03-13 NOTE — Assessment & Plan Note (Signed)
BP Readings from Last 3 Encounters:  03/13/20 122/74  11/15/19 120/80  01/01/19 123/81   Stable, pt to continue medical treatment lopressor   Current Outpatient Medications (Cardiovascular):  .  metoprolol tartrate (LOPRESSOR) 25 MG tablet, TAKE 1 TABLET BY MOUTH DAILY   Current Outpatient Medications (Analgesics):  Marland Kitchen  IBUPROFEN PO, Take by mouth as needed.   Current Outpatient Medications (Other):  Marland Kitchen  Multiple Vitamin (MULTIVITAMIN) capsule, Take 1 capsule by mouth daily.

## 2020-03-13 NOTE — Patient Instructions (Signed)

## 2020-03-13 NOTE — Assessment & Plan Note (Signed)
Lab Results  Component Value Date   HGBA1C 6.2 12/02/2018   Stable, pt to continue current medical treatment  - diet and wt control

## 2020-03-13 NOTE — Assessment & Plan Note (Addendum)
Age and sex appropriate education and counseling updated with regular exercise and diet Referrals for preventative services - none needed Immunizations addressed - for Tdap today Smoking counseling  - none needed Evidence for depression or other mood disorder - none significant Most recent labs reviewed. I have personally reviewed and have noted: 1) the patient's medical and social history 2) The patient's current medications and supplements 3) The patient's height, weight, and BMI have been recorded in the chart  

## 2020-03-13 NOTE — Assessment & Plan Note (Signed)
Last vitamin D Lab Results  Component Value Date   VD25OH 28.11 (L) 12/02/2018   Low, to start oral replacement

## 2020-03-13 NOTE — Progress Notes (Signed)
Patient ID: [name], female   DOB: 01/27/1970, 50 y.o.   MRN: [id number]         Chief Complaint:: wellness exam and Medication Refill         HPI:  [name] is a 50 y.o. female here for wellness exam, and up to date with immunizations except due for Tdap and preventive referrals.  Seesn GYN yearly.                          Also not taking VIt D.  Holding her own on weight., Trying to be more active and following lower chol diet.  Currently engaged, has 1 child college, 1 child at home and fiancee has 2 children at home as well. Pt denies chest pain, increased sob or doe, wheezing, orthopnea, PND, increased LE swelling, palpitations, dizziness or syncope.  Pt denies polydipsia, polyuria, Denies any focal neuro s/s.  Good compliance with meds. 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      Wt Readings from Last 3 Encounters:  03/13/20 195 lb (88.5 kg)  11/15/19 192 lb (87.1 kg)  01/01/19 193 lb (87.5 kg)   BP Readings from Last 3 Encounters:  03/13/20 122/74  11/15/19 120/80  01/01/19 123/81   Immunization History  Administered Date(s) Administered  . Influenza Split 10/16/2011  . Influenza Whole 11/05/2007  . Influenza, Seasonal, Injecte, Preservative Fre 10/02/2012  . Influenza,inj,Quad PF,6+ Mos 09/29/2012, 10/31/2016, 10/15/2017, 10/22/2018, 11/15/2019  . Influenza-Unspecified 11/30/2013  . PFIZER(Purple Top)SARS-COV-2 Vaccination 03/22/2019, 04/19/2019, 01/10/2020  . Td 06/21/2008  . Tdap 03/13/2020   There are no preventive care reminders to display for this patient.    Past Medical History:  Diagnosis Date  . ALLERGIC RHINITIS   . ANXIETY   . GERD   . Gestational diabetes   . HYPERLIPIDEMIA   . Impaired glucose tolerance   . Tachycardia    Past Surgical History:  Procedure Laterality Date  . CESAREAN SECTION  2003/2008   x 2  . NASAL SINUS SURGERY  1993  . TONSILLECTOMY AND ADENOIDECTOMY       reports that she has never smoked. She has never used smokeless tobacco. She reports that she does not drink alcohol and does not use drugs. family history includes Atrial fibrillation in her mother; Breast cancer (age of onset: 32) in her mother; Colon polyps in her father and mother; Diabetes in her father; Heart disease in her father; Liver disease in her father; Parkinson's disease in her mother. Allergies  Allergen Reactions  . Ciprofloxacin Rash  . Sulfonamide Derivatives Rash   Current Outpatient Medications on File Prior to Visit  Medication Sig Dispense Refill  . IBUPROFEN PO Take by mouth as needed.    . Multiple Vitamin (MULTIVITAMIN) capsule Take 1 capsule by mouth daily.     No current facility-administered medications on file prior to visit.        ROS:  All others reviewed and negative.  Objective        PE:  BP 122/74   Pulse 94   Temp 98.3 F (36.8 C) (Oral)   Ht 4\' 11"  (1.499 m)   Wt 195 lb (88.5 kg)   SpO2 96%   BMI 39.39 kg/m                 Constitutional: Pt appears in NAD  HENT: Head: NCAT.                Right Ear: External ear normal.                 Left Ear: External ear normal.                Eyes: . Pupils are equal, round, and reactive to light. Conjunctivae and EOM are normal               Nose: without d/c or deformity               Neck: Neck supple. Gross normal ROM               Cardiovascular: Normal rate and regular rhythm.                 Pulmonary/Chest: Effort normal and breath sounds without rales or wheezing.                Abd:  Soft, NT, ND, + BS, no organomegaly               Neurological: Pt is alert. At baseline orientation, motor grossly intact               Skin: Skin is warm. No rashes, no other new lesions, LE edema - none               Psychiatric: Pt behavior is normal without agitation   Micro: none  Cardiac tracings I have personally interpreted today:  none  Pertinent Radiological findings  (summarize): none   Lab Results  Component Value Date   WBC 10.7 (H) 12/02/2018   HGB 13.4 12/02/2018   HCT 39.0 12/02/2018   PLT 304.0 12/02/2018   GLUCOSE 100 (H) 12/02/2018   CHOL 192 12/02/2018   TRIG 91.0 12/02/2018   HDL 52.40 12/02/2018   LDLDIRECT 142.5 09/01/2006   LDLCALC 121 (H) 12/02/2018   ALT 17 12/02/2018   AST 15 12/02/2018   NA 139 12/02/2018   K 3.4 (L) 12/02/2018   CL 102 12/02/2018   CREATININE 0.58 12/02/2018   BUN 9 12/02/2018   CO2 29 12/02/2018   TSH 2.49 12/02/2018   HGBA1C 6.2 12/02/2018   Assessment/Plan:  [name] is a 50 y.o. White or Caucasian [1] female with  has a past medical history of ALLERGIC RHINITIS, ANXIETY, GERD, Gestational diabetes, HYPERLIPIDEMIA, Impaired glucose tolerance, and Tachycardia.  Encounter for well adult exam with abnormal findings Age and sex appropriate education and counseling updated with regular exercise and diet Referrals for preventative services - none needed Immunizations addressed - for Tdap today Smoking counseling  - none needed Evidence for depression or other mood disorder - none significant Most recent labs reviewed. I have personally reviewed and have noted: 1) the patient's medical and social history 2) The patient's current medications and supplements 3) The patient's height, weight, and BMI have been recorded in the chart   HTN (hypertension) BP Readings from Last 3 Encounters:  03/13/20 122/74  11/15/19 120/80  01/01/19 123/81   Stable, pt to continue medical treatment lopressor   Current Outpatient Medications (Cardiovascular):  .  metoprolol tartrate (LOPRESSOR) 25 MG tablet, TAKE 1 TABLET BY MOUTH DAILY   Current Outpatient Medications (Analgesics):  Marland Kitchen  IBUPROFEN PO, Take by mouth as needed.   Current Outpatient Medications (Other):  Marland Kitchen  Multiple Vitamin (MULTIVITAMIN) capsule, Take 1 capsule by mouth daily.  Hyperlipidemia Lab Results  Component Value Date    LDLCALC 121 (H) 12/02/2018   Stable, pt to continue current low chol diet, declines statin   Impaired glucose tolerance Lab Results  Component Value Date   HGBA1C 6.2 12/02/2018   Stable, pt to continue current medical treatment  - diet and wt control   Vitamin D deficiency Last vitamin D Lab Results  Component Value Date   VD25OH 28.11 (L) 12/02/2018   Low, to start oral replacement   Followup: Return in about 1 year (around 03/13/2021).  Cathlean Cower, MD 03/13/2020 8:46 AM [facility]

## 2020-04-25 ENCOUNTER — Ambulatory Visit: Payer: BC Managed Care – PPO | Admitting: Physician Assistant

## 2020-04-25 ENCOUNTER — Encounter: Payer: Self-pay | Admitting: Physician Assistant

## 2020-04-25 VITALS — BP 118/86 | HR 91 | Ht 60.0 in | Wt 200.1 lb

## 2020-04-25 DIAGNOSIS — L290 Pruritus ani: Secondary | ICD-10-CM

## 2020-04-25 DIAGNOSIS — K602 Anal fissure, unspecified: Secondary | ICD-10-CM | POA: Diagnosis not present

## 2020-04-25 DIAGNOSIS — K640 First degree hemorrhoids: Secondary | ICD-10-CM | POA: Diagnosis not present

## 2020-04-25 MED ORDER — AMBULATORY NON FORMULARY MEDICATION: 0 refills | Status: DC

## 2020-04-25 NOTE — Patient Instructions (Addendum)
If you are age 50 or older, your body mass index should be between 23-30. Your Body mass index is 39.08 kg/m. If this is out of the aforementioned range listed, please consider follow up with your Primary Care Provider.  If you are age 37 or younger, your body mass index should be between 19-25. Your Body mass index is 39.08 kg/m. If this is out of the aformentioned range listed, please consider follow up with your Primary Care Provider.   We have sent a prescription for nitroglycerin 0.125% gel to [facility]. You should apply a pea size amount to your rectum three times daily x 6-8 weeks.  [facility]'s information is below: Address: [address], [place], [place] [address]  [phone]  *Please DO NOT go directly from our office to pick up this medication! Give the pharmacy 1 day to process the prescription as this is compounded and takes time to make.  Use Desitin twice daily and keep area dry.  Try a fiber supplement such Benefiber or Metamucil  Thank you for choosing me and [facility].  Ellouise Newer, PA-C

## 2020-04-25 NOTE — Progress Notes (Signed)
Chief Complaint: Anal itching  HPI:    [name] is a 50 year old Caucasian female with a past medical history as listed below, known to Dr. Ardis Hughs, who presents clinic today with a complaint of anal itching.    01/01/2019 colonoscopy with one 2 mm polyp in the descending colon, diverticulosis in left colon otherwise normal.  Pathology showed tubular adenoma repeat recommended in 7 years.    Today, the patient presents to clinic and tells me for the past month she has felt a lot of rectal itching.  Tells me that she has also been noticing a sort of discharge from her rectum which is wet and has been wearing panty liners.  Along with this has also been experiencing some lower back pain.  Tells me she has tried various over-the-counter creams including Hydrocortisone which are not really helping.  Also tried a Paediatric nurse brand hemorrhoid suppository inconsistently which made no difference.  At one point patient tells me she was applying a cream back there and felt a "stinging pain".  Also tells me that when she is itching sometimes she gets some blood.  Does describe soft solid normal stools 1-2 times a day which make her feel completely empty.    Denies fever, chills, weight loss, change in bowel habits, abdominal pain or symptoms that awaken her from sleep  Past Medical History:  Diagnosis Date  . ALLERGIC RHINITIS   . ANXIETY   . GERD   . Gestational diabetes   . HYPERLIPIDEMIA   . Impaired glucose tolerance   . Tachycardia     Past Surgical History:  Procedure Laterality Date  . CESAREAN SECTION  2003/2008   x 2  . NASAL SINUS SURGERY  1993  . TONSILLECTOMY AND ADENOIDECTOMY      Current Outpatient Medications  Medication Sig Dispense Refill  . IBUPROFEN PO Take by mouth as needed.    . metoprolol tartrate (LOPRESSOR) 25 MG tablet TAKE 1 TABLET BY MOUTH DAILY 90 tablet 3  . Multiple Vitamin (MULTIVITAMIN) capsule Take 1 capsule by mouth daily.     No current facility-administered  medications for this visit.    Allergies as of 04/25/2020 - Review Complete 03/13/2020  Allergen Reaction Noted  . Ciprofloxacin Rash 09/01/2006  . Sulfonamide derivatives Rash 09/01/2006    Family History  Problem Relation Age of Onset  . Breast cancer Mother 10  . Colon polyps Mother   . Parkinson's disease Mother   . Atrial fibrillation Mother   . Colon polyps Father   . Diabetes Father   . Heart disease Father   . Liver disease Father   . Colon cancer Neg Hx   . Esophageal cancer Neg Hx   . Rectal cancer Neg Hx   . Stomach cancer Neg Hx     Social History   Socioeconomic History  . Marital status: Divorced    Spouse name: Not on file  . Number of children: 2  . Years of education: Not on file  . Highest education level: Not on file  Occupational History  . Occupation: Product manager: [employer]  Tobacco Use  . Smoking status: Never Smoker  . Smokeless tobacco: Never Used  Vaping Use  . Vaping Use: Never used  Substance and Sexual Activity  . Alcohol use: No    Comment: occasional-2 per month  . Drug use: No  . Sexual activity: Not Currently    Comment: 1ST INTERCOURSE- 26, PARTNERS- 3,   Other  Topics Concern  . Not on file  Social History Narrative  . Not on file   Social Determinants of Health   Financial Resource Strain: Not on file  Food Insecurity: Not on file  Transportation Needs: Not on file  Physical Activity: Not on file  Stress: Not on file  Social Connections: Not on file  Intimate Partner Violence: Not on file    Review of Systems:    Constitutional: No weight loss, fever or chills Cardiovascular: No chest pain   Respiratory: No SOB Gastrointestinal: See HPI and otherwise negative   Physical Exam:  Vital signs: BP 118/86   Pulse 91   Ht 5' (1.524 m)   Wt 200 lb 2 oz (90.8 kg)   BMI 39.08 kg/m   Constitutional:   Pleasant overweight Caucasian female appears to be in NAD, Well developed, Well nourished, alert and  cooperative Respiratory: Respirations even and unlabored. Lungs clear to auscultation bilaterally.   No wheezes, crackles, or rhonchi.  Cardiovascular: Normal S1, S2. No MRG. Regular rate and rhythm. No peripheral edema, cyanosis or pallor.  Gastrointestinal:  Soft, nondistended, nontender. No rebound or guarding. Normal bowel sounds. No appreciable masses or hepatomegaly. Rectal: External: Shallow anal fissure posteriorly with tenderness to palpation, external hemorrhoid tag, also dulling bilateral rash on buttocks which seems to be getting better; internal: No residue, no tenderness, no mass; anoscopy: Grade 1 internal hemorrhoids Psychiatric: Demonstrates good judgement and reason without abnormal affect or behaviors.  RELEVANT LABS AND IMAGING: CBC    Component Value Date/Time   WBC 10.9 (H) 03/13/2020 0841   RBC 4.45 03/13/2020 0841   HGB 13.4 03/13/2020 0841   HCT 38.8 03/13/2020 0841   PLT 373.0 03/13/2020 0841   MCV 87.3 03/13/2020 0841   MCHC 34.6 03/13/2020 0841   RDW 13.2 03/13/2020 0841   LYMPHSABS 1.8 03/13/2020 0841   MONOABS 0.7 03/13/2020 0841   EOSABS 0.4 03/13/2020 0841   BASOSABS 0.1 03/13/2020 0841    CMP     Component Value Date/Time   NA 139 12/02/2018 1450   K 3.4 (L) 12/02/2018 1450   CL 102 12/02/2018 1450   CO2 29 12/02/2018 1450   GLUCOSE 100 (H) 12/02/2018 1450   BUN 9 12/02/2018 1450   CREATININE 0.58 12/02/2018 1450   CALCIUM 8.9 12/02/2018 1450   PROT 6.6 03/13/2020 0841   ALBUMIN 4.1 03/13/2020 0841   AST 17 03/13/2020 0841   ALT 18 03/13/2020 0841   ALKPHOS 56 03/13/2020 0841   BILITOT 0.4 03/13/2020 0841   GFRNONAA 133.27 01/16/2010 0840   GFRAA 180 09/01/2006 1637    Assessment: 1.  Anal fissure: Seen at time of exam today, very shallow, appears to be healing but still tender to palpation per patient 2.  Anal pruritus: With above 3.  Grade 1 internal hemorrhoids: I do not feel these are contributing to symptoms at this  time  Plan: 1.  Prescribed Nitroglycerin ointment to be applied twice daily for the next 4 to 6 weeks.  Discussed with patient it may not take this long as the fissure seems very shallow and possibly may be healing already given to her itching issues. 2.  Discussed with patient that the area also seems to be staying moist, would recommend that she dry this area thoroughly, could try a wet wipe and dab dry with toilet paper and apply Desitin, A&D or other zinc ointment as a barrier cream, recommend she avoid applying Nitroglycerin and this cream at the same  time. 3.  Recommend the patient increase fiber in her diet with a fiber supplement daily, hopefully this will help with some of the discharge. 4.  Patient to follow in clinic as needed if symptoms persist  Ellouise Newer, PA-C [facility] 04/25/2020, 2:48 PM  Cc: Biagio Borg, MD

## 2020-04-26 NOTE — Progress Notes (Signed)
I agree with the above note, plan 

## 2020-11-20 ENCOUNTER — Ambulatory Visit: Payer: BC Managed Care – PPO | Admitting: Obstetrics & Gynecology

## 2020-11-28 ENCOUNTER — Ambulatory Visit (INDEPENDENT_AMBULATORY_CARE_PROVIDER_SITE_OTHER): Payer: BC Managed Care – PPO | Admitting: Nurse Practitioner

## 2020-11-28 ENCOUNTER — Encounter: Payer: Self-pay | Admitting: Nurse Practitioner

## 2020-11-28 ENCOUNTER — Other Ambulatory Visit: Payer: Self-pay

## 2020-11-28 VITALS — BP 124/76 | Ht 61.0 in | Wt 201.0 lb

## 2020-11-28 DIAGNOSIS — N951 Menopausal and female climacteric states: Secondary | ICD-10-CM

## 2020-11-28 DIAGNOSIS — Z23 Encounter for immunization: Secondary | ICD-10-CM

## 2020-11-28 DIAGNOSIS — N898 Other specified noninflammatory disorders of vagina: Secondary | ICD-10-CM | POA: Diagnosis not present

## 2020-11-28 DIAGNOSIS — Z01419 Encounter for gynecological examination (general) (routine) without abnormal findings: Secondary | ICD-10-CM | POA: Diagnosis not present

## 2020-11-28 LAB — WET PREP FOR TRICH, YEAST, CLUE

## 2020-11-28 NOTE — Progress Notes (Signed)
[name] 1970/02/01 [id number]   History:  50 y.o. G2P2 presents for annual exam with complaints of vaginal odor with mild itching, no discharge. Irregular menses that range every 2-3 months with mild menopausal symptoms. Normal pap and mammogram history. HTN managed by PCP.   Gynecologic History No LMP recorded. (Menstrual status: Irregular Periods).   Contraception/Family planning: abstinence d/t fiance's health Sexually active: No  Health Maintenance Last Pap: 11/15/2019. Results were: Normal, 3-year repeat Last mammogram: 03/01/2020. Results were: Normal Last colonoscopy: 01/01/2019. Results were: Tubular adenoma, 7-year recall Last Dexa: Not indicated  Past medical history, past surgical history, family history and social history were all reviewed and documented in the EPIC chart. 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 Daughter freshman in [place]. Mother diagnosed with breast cancer at age 80.   ROS:  A ROS was performed and pertinent positives and negatives are included.  Exam:  Vitals:   11/28/20 1507  BP: 124/76  Weight: 201 lb (91.2 kg)  Height: 5\' 1"  (1.549 m)   Body mass index is 37.98 kg/m.  General appearance:  Normal Thyroid:  Symmetrical, normal in size, without palpable masses or nodularity. Respiratory  Auscultation:  Clear without wheezing or rhonchi Cardiovascular  Auscultation:  Regular rate, without rubs, murmurs or gallops  Edema/varicosities:  Not grossly evident Abdominal  Soft,nontender, without masses, guarding or rebound.  Liver/spleen:  No organomegaly noted  Hernia:  None appreciated  Skin  Inspection:  Grossly normal Breasts: Examined lying and sitting.   Right: Without masses, retractions, nipple discharge or axillary adenopathy.   Left: Without masses, retractions, nipple discharge or axillary adenopathy. Genitourinary   Inguinal/mons:  Normal without inguinal adenopathy  External  genitalia:  Normal appearing vulva with no masses, tenderness, or lesions  BUS/Urethra/Skene's glands:  Normal  Vagina:  Normal appearing with normal color and discharge, no lesions  Cervix:  Normal appearing without discharge or lesions  Uterus:  Difficult to palpate due to body habitus but no gross masses or tenderness  Adnexa/parametria:     Rt: Normal in size, without masses or tenderness.   Lt: Normal in size, without masses or tenderness.  Anus and perineum: Normal  Digital rectal exam: Normal sphincter tone without palpated masses or tenderness  Wet prep negative  Patient informed chaperone available to be present for breast and pelvic exam. Patient has requested no chaperone to be present. Patient has been advised what will be completed during breast and pelvic exam.   Assessment/Plan:  50 y.o. G2P2 for annual exam.   Well female exam with routine gynecological exam - Education provided on SBEs, importance of preventative screenings, current guidelines, high calcium diet, regular exercise, and multivitamin daily. Labs with PCP.   Vaginal odor - Plan: WET PREP FOR [place], YEAST, CLUE. Negative wet prep. Reassurance provided on normal wet prep and exam. Odor likely from groin sweat - recommend mild soap, washing daily, and wearing breathable underwear to help with odor.   Perimenopause - Having irregular menses that are typically every 2-3 months. Some mild hot flashes and night sweats. Discussed what to expect with perimenopause and normal bleeding pattern changes.   Screening for cervical cancer - Normal Pap history.  Will repeat at 3-year interval per guidelines.  Screening for breast cancer - Normal mammogram history.  Continue annual screenings.  Normal breast exam today.  Screening for colon cancer - 2020 colonoscopy. Will repeat at GI's recommended interval.   Return in 1 year for  annual.   Tamela Gammon DNP, 3:34 PM 11/28/2020

## 2021-01-29 ENCOUNTER — Other Ambulatory Visit: Payer: Self-pay | Admitting: Obstetrics & Gynecology

## 2021-01-29 DIAGNOSIS — Z1231 Encounter for screening mammogram for malignant neoplasm of breast: Secondary | ICD-10-CM

## 2021-03-07 ENCOUNTER — Ambulatory Visit: Payer: BC Managed Care – PPO | Admitting: Internal Medicine

## 2021-03-07 ENCOUNTER — Other Ambulatory Visit: Payer: Self-pay

## 2021-03-07 VITALS — BP 122/84 | Temp 98.6°F | Ht 61.0 in | Wt 202.8 lb

## 2021-03-07 DIAGNOSIS — R0600 Dyspnea, unspecified: Secondary | ICD-10-CM | POA: Diagnosis not present

## 2021-03-07 DIAGNOSIS — E785 Hyperlipidemia, unspecified: Secondary | ICD-10-CM

## 2021-03-07 DIAGNOSIS — Z0001 Encounter for general adult medical examination with abnormal findings: Secondary | ICD-10-CM | POA: Diagnosis not present

## 2021-03-07 DIAGNOSIS — E559 Vitamin D deficiency, unspecified: Secondary | ICD-10-CM

## 2021-03-07 DIAGNOSIS — E538 Deficiency of other specified B group vitamins: Secondary | ICD-10-CM

## 2021-03-07 DIAGNOSIS — R7302 Impaired glucose tolerance (oral): Secondary | ICD-10-CM

## 2021-03-07 MED ORDER — ALBUTEROL SULFATE HFA 108 (90 BASE) MCG/ACT IN AERS: 2.0000 | INHALATION_SPRAY | Freq: Four times a day (QID) | RESPIRATORY_TRACT | 5 refills | Status: DC | PRN

## 2021-03-07 NOTE — Assessment & Plan Note (Signed)
Last vitamin D Lab Results  Component Value Date   VD25OH 24.58 (L) 03/13/2020   Low, to start oral replacement

## 2021-03-07 NOTE — Progress Notes (Signed)
Patient ID: [name], female   DOB: [birth year], 51 y.o.   MRN: [id number]         Chief Complaint:: wellness exam and Hospitalization Follow-up (Patient had COVID around Par[place] day, still hasn't been able to "get a good breath" and when she went to the ED they did an X-ray of her chest and told her they "saw an area of concern in the lungs" but would not elaborate on it. Patient would like to discuss this further and get another chest X-ray. ) and Medication Refill (Metoprolol refill )         HPI:  [name] is a 51 y.o. female here for wellness exam; decliens covid booster, shingirx ; has GYN exam and mammogram scheduled soon, o/w up to date                        Also frustrated over lack of explanation regarding abnormal CXR at urgent care yesterday, forced to come here to get clarification somehow.  Pt denies chest pain, wheezing, orthopnea, PND, increased LE swelling, palpitations, dizziness or syncope, but has had recent onset mild intermittent sob/doe, ? Associated with worsening obesity ?  Willing to try inhaler prn trial.  Not taking Vit D.   Pt denies polydipsia, polyuria, or new focal neuro s/s.   Pt denies fever, wt loss, night sweats, loss of appetite, or other constitutional symptoms    Wt Readings from Last 3 Encounters:  03/07/21 202 lb 12.8 oz (92 kg)  11/28/20 201 lb (91.2 kg)  04/25/20 200 lb 2 oz (90.8 kg)   BP Readings from Last 3 Encounters:  03/07/21 122/84  11/28/20 124/76  04/25/20 118/86   Immunization History  Administered Date(s) Administered   Influenza Split 10/16/2011   Influenza Whole 11/05/2007   Influenza, Seasonal, Injecte, Preservative Fre 10/02/2012   Influenza,inj,Quad PF,6+ Mos 09/29/2012, 10/31/2016, 10/15/2017, 10/22/2018, 11/15/2019, 11/28/2020   Influenza-Unspecified 11/30/2013   PFIZER(Purple Top)SARS-COV-2 Vaccination 03/22/2019, 04/19/2019, 01/10/2020   Td 06/21/2008   Tdap 03/13/2020   There are no preventive care reminders to  display for this patient.     Past Medical History:  Diagnosis Date   ALLERGIC RHINITIS    ANXIETY    GERD    Gestational diabetes    HYPERLIPIDEMIA    Impaired glucose tolerance    Tachycardia    Past Surgical History:  Procedure Laterality Date   CESAREAN SECTION  2003/2008   x 2   NASAL SINUS SURGERY  1993   TONSILLECTOMY AND ADENOIDECTOMY      reports that she has never smoked. She has never used smokeless tobacco. She reports current alcohol use. She reports that she does not use drugs. family history includes Atrial fibrillation in her mother; Breast cancer (age of onset: 24) in her mother; Colon polyps in her father and mother; Diabetes in her father; Heart disease in her father; Liver disease in her father; Parkinson's disease in her mother. Allergies  Allergen Reactions   Ciprofloxacin Rash   Sulfonamide Derivatives Rash   Current Outpatient Medications on File Prior to Visit  Medication Sig Dispense Refill   AMBULATORY NON FORMULARY MEDICATION Nitroglycerine ointment 0.125 %  Apply a pea sized amount internally four times daily. Dispense 30 GM zero refill 30 g 0   amoxicillin-clavulanate (AUGMENTIN) 875-125 MG tablet Take 1 tablet by mouth 2 (two) times daily.     clobetasol (TEMOVATE) 0.05 % external solution Apply topically.  IBUPROFEN PO Take by mouth as needed.     metoprolol tartrate (LOPRESSOR) 25 MG tablet TAKE 1 TABLET BY MOUTH DAILY 90 tablet 3   Multiple Vitamin (MULTIVITAMIN) capsule Take 1 capsule by mouth daily.     No current facility-administered medications on file prior to visit.        ROS:  All others reviewed and negative.  Objective        PE:  BP 122/84 (BP Location: Left Arm, Patient Position: Sitting, Cuff Size: Large)    Temp 98.6 F (37 C) (Oral)    Ht 5\' 1"  (1.549 m)    Wt 202 lb 12.8 oz (92 kg)    LMP  (LMP Unknown)    BMI 38.32 kg/m                 Constitutional: Pt appears in NAD               HENT: Head: NCAT.                 Right Ear: External ear normal.                 Left Ear: External ear normal.                Eyes: . Pupils are equal, round, and reactive to light. Conjunctivae and EOM are normal               Nose: without d/c or deformity               Neck: Neck supple. Gross normal ROM               Cardiovascular: Normal rate and regular rhythm.                 Pulmonary/Chest: Effort normal and breath sounds without rales or wheezing.                Abd:  Soft, NT, ND, + BS, no organomegaly               Neurological: Pt is alert. At baseline orientation, motor grossly intact               Skin: Skin is warm. No rashes, no other new lesions, LE edema - none               Psychiatric: Pt behavior is normal without agitation   Micro: none  Cardiac tracings I have personally interpreted today:  none  Pertinent Radiological findings (summarize): none   Lab Results  Component Value Date   WBC 8.4 03/08/2021   HGB 13.0 03/08/2021   HCT 38.5 03/08/2021   PLT 306.0 03/08/2021   GLUCOSE 127 (H) 03/08/2021   CHOL 176 03/08/2021   TRIG 92.0 03/08/2021   HDL 50.30 03/08/2021   LDLDIRECT 142.5 09/01/2006   LDLCALC 108 (H) 03/08/2021   ALT 30 03/08/2021   AST 22 03/08/2021   NA 140 03/08/2021   K 3.5 03/08/2021   CL 103 03/08/2021   CREATININE 0.62 03/08/2021   BUN 10 03/08/2021   CO2 33 (H) 03/08/2021   TSH 3.06 03/08/2021   HGBA1C 6.7 (H) 03/08/2021   Assessment/Plan:  [name] is a 51 y.o. White or Caucasian [1] female with  has a past medical history of ALLERGIC RHINITIS, ANXIETY, GERD, Gestational diabetes, HYPERLIPIDEMIA, Impaired glucose tolerance, and Tachycardia.  Vitamin D deficiency Last vitamin D Lab Results  Component Value  Date   VD25OH 24.58 (L) 03/13/2020   Low, to start oral replacement   Encounter for well adult exam with abnormal findings Age and sex appropriate education and counseling updated with regular exercise and diet Referrals for preventative  services - pt to see GYN with pap, mammogram soon Immunizations addressed - declines covid booster, shingrix Smoking counseling  - none needed Evidence for depression or other mood disorder - mild persistent anxiety no change Most recent labs reviewed. I have personally reviewed and have noted: 1) the patient's medical and social history 2) The patient's current medications and supplements 3) The patient's height, weight, and BMI have been recorded in the chart   Hyperlipidemia Lab Results  Component Value Date   LDLCALC 108 (H) 03/08/2021   Mild uncontrolled, goal ldl < 70, pt to start crestor 10   Impaired glucose tolerance Lab Results  Component Value Date   HGBA1C 6.7 (H) 03/08/2021   New worsening with increased wt, will hold on OHA start for now, but for referral DM education   Dyspnea Etiology unclear, afeb and nontoxic, for f/u cxr, work on wt loss, and empiric albuterol hfa prn Followup: No follow-ups on file.  Cathlean Cower, MD 03/11/2021 2:28 PM [facility]

## 2021-03-07 NOTE — Patient Instructions (Addendum)
Please take all new medication as prescribed - the inhaler as needed  Please continue all other medications as before, and refills have been done if requested.  Please have the pharmacy call with any other refills you may need.  Please continue your efforts at being more active, low cholesterol diet, and weight control.  You are otherwise up to date with prevention measures today.  Please keep your appointments with your specialists as you may have planned  Please go to the XRAY Department in the first floor for the x-ray testing - at the [facility] xray tomorrow  Please go to the LAB at the blood drawing area for the tests to be done - at the [place] will be contacted by phone if any changes need to be made immediately.  Otherwise, you will receive a letter about your results with an explanation, but please check with MyChart first.  Please remember to sign up for MyChart if you have not done so, as this will be important to you in the future with finding out test results, communicating by private email, and scheduling acute appointments online when needed.  Please make an Appointment to return for your 1 year visit, or sooner if needed

## 2021-03-08 ENCOUNTER — Other Ambulatory Visit (INDEPENDENT_AMBULATORY_CARE_PROVIDER_SITE_OTHER): Payer: BC Managed Care – PPO

## 2021-03-08 ENCOUNTER — Ambulatory Visit (INDEPENDENT_AMBULATORY_CARE_PROVIDER_SITE_OTHER)
Admission: RE | Admit: 2021-03-08 | Discharge: 2021-03-08 | Disposition: A | Discharge status: Home / Self Care | Payer: BC Managed Care – PPO | Source: Ambulatory Visit | Attending: Internal Medicine | Admitting: Internal Medicine

## 2021-03-08 ENCOUNTER — Other Ambulatory Visit: Payer: Self-pay | Admitting: Internal Medicine

## 2021-03-08 DIAGNOSIS — E559 Vitamin D deficiency, unspecified: Secondary | ICD-10-CM | POA: Diagnosis not present

## 2021-03-08 DIAGNOSIS — E538 Deficiency of other specified B group vitamins: Secondary | ICD-10-CM

## 2021-03-08 DIAGNOSIS — E1165 Type 2 diabetes mellitus with hyperglycemia: Secondary | ICD-10-CM

## 2021-03-08 DIAGNOSIS — R0600 Dyspnea, unspecified: Secondary | ICD-10-CM

## 2021-03-08 DIAGNOSIS — R7302 Impaired glucose tolerance (oral): Secondary | ICD-10-CM | POA: Diagnosis not present

## 2021-03-08 DIAGNOSIS — E785 Hyperlipidemia, unspecified: Secondary | ICD-10-CM

## 2021-03-08 LAB — LIPID PANEL
Cholesterol: 176 mg/dL (ref 0–200)
HDL: 50.3 mg/dL (ref >39.00)
LDL Cholesterol: 108 mg/dL — ABNORMAL HIGH (ref 0–99)
NonHDL: 126.17
Total CHOL/HDL Ratio: 4
Triglycerides: 92 mg/dL (ref 0.0–149.0)
VLDL: 18.4 mg/dL (ref 0.0–40.0)

## 2021-03-08 LAB — TSH: TSH: 3.06 u[IU]/mL (ref 0.35–5.50)

## 2021-03-08 LAB — URINALYSIS, ROUTINE W REFLEX MICROSCOPIC
Bilirubin Urine: NEGATIVE
Hgb urine dipstick: NEGATIVE
Ketones, ur: NEGATIVE
Leukocytes,Ua: NEGATIVE
Nitrite: NEGATIVE
Specific Gravity, Urine: 1.01 (ref 1.000–1.030)
Total Protein, Urine: NEGATIVE
Urine Glucose: NEGATIVE
Urobilinogen, UA: 0.2 (ref 0.0–1.0)
pH: 7 (ref 5.0–8.0)

## 2021-03-08 LAB — CBC WITH DIFFERENTIAL/PLATELET
Basophils Absolute: 0.1 10*3/uL (ref 0.0–0.1)
Basophils Relative: 0.8 % (ref 0.0–3.0)
Eosinophils Absolute: 0.5 10*3/uL (ref 0.0–0.7)
Eosinophils Relative: 6.5 % — ABNORMAL HIGH (ref 0.0–5.0)
HCT: 38.5 % (ref 36.0–46.0)
Hemoglobin: 13 g/dL (ref 12.0–15.0)
Lymphocytes Relative: 19.2 % (ref 12.0–46.0)
Lymphs Abs: 1.6 10*3/uL (ref 0.7–4.0)
MCHC: 33.8 g/dL (ref 30.0–36.0)
MCV: 87.1 fl (ref 78.0–100.0)
Monocytes Absolute: 0.5 10*3/uL (ref 0.1–1.0)
Monocytes Relative: 6 % (ref 3.0–12.0)
Neutro Abs: 5.6 10*3/uL (ref 1.4–7.7)
Neutrophils Relative %: 67.5 % (ref 43.0–77.0)
Platelets: 306 10*3/uL (ref 150.0–400.0)
RBC: 4.42 Mil/uL (ref 3.87–5.11)
RDW: 13.3 % (ref 11.5–15.5)
WBC: 8.4 10*3/uL (ref 4.0–10.5)

## 2021-03-08 LAB — BASIC METABOLIC PANEL
BUN: 10 mg/dL (ref 6–23)
CO2: 33 mEq/L — ABNORMAL HIGH (ref 19–32)
Calcium: 9.2 mg/dL (ref 8.4–10.5)
Chloride: 103 mEq/L (ref 96–112)
Creatinine, Ser: 0.62 mg/dL (ref 0.40–1.20)
GFR: 103.71 mL/min (ref >60.00)
Glucose, Bld: 127 mg/dL — ABNORMAL HIGH (ref 70–99)
Potassium: 3.5 mEq/L (ref 3.5–5.1)
Sodium: 140 mEq/L (ref 135–145)

## 2021-03-08 LAB — HEPATIC FUNCTION PANEL
ALT: 30 U/L (ref 0–35)
AST: 22 U/L (ref 0–37)
Albumin: 4.2 g/dL (ref 3.5–5.2)
Alkaline Phosphatase: 55 U/L (ref 39–117)
Bilirubin, Direct: 0.1 mg/dL (ref 0.0–0.3)
Total Bilirubin: 0.5 mg/dL (ref 0.2–1.2)
Total Protein: 7.2 g/dL (ref 6.0–8.3)

## 2021-03-08 LAB — VITAMIN D 25 HYDROXY (VIT D DEFICIENCY, FRACTURES): VITD: 28.54 ng/mL — ABNORMAL LOW (ref 30.00–100.00)

## 2021-03-08 LAB — HEMOGLOBIN A1C: Hgb A1c MFr Bld: 6.7 % — ABNORMAL HIGH (ref 4.6–6.5)

## 2021-03-08 LAB — VITAMIN B12: Vitamin B-12: 762 pg/mL (ref 211–911)

## 2021-03-08 MED ORDER — ROSUVASTATIN CALCIUM 10 MG PO TABS: 10.0000 mg | ORAL_TABLET | Freq: Every day | ORAL | 3 refills | Status: DC

## 2021-03-08 MED ORDER — CHOLECALCIFEROL 50 MCG (2000 UT) PO TABS: ORAL_TABLET | ORAL | 99 refills | Status: DC

## 2021-03-09 ENCOUNTER — Encounter: Payer: Self-pay | Admitting: Internal Medicine

## 2021-03-09 NOTE — Telephone Encounter (Signed)
Erroneous entry

## 2021-03-11 DIAGNOSIS — R0600 Dyspnea, unspecified: Secondary | ICD-10-CM | POA: Insufficient documentation

## 2021-03-11 NOTE — Assessment & Plan Note (Signed)
Age and sex appropriate education and counseling updated with regular exercise and diet Referrals for preventative services - pt to see GYN with pap, mammogram soon Immunizations addressed - declines covid booster, shingrix Smoking counseling  - none needed Evidence for depression or other mood disorder - mild persistent anxiety no change Most recent labs reviewed. I have personally reviewed and have noted: 1) the patient's medical and social history 2) The patient's current medications and supplements 3) The patient's height, weight, and BMI have been recorded in the chart

## 2021-03-11 NOTE — Assessment & Plan Note (Signed)
Etiology unclear, afeb and nontoxic, for f/u cxr, work on wt loss, and empiric albuterol hfa prn

## 2021-03-11 NOTE — Assessment & Plan Note (Signed)
Lab Results  Component Value Date   LDLCALC 108 (H) 03/08/2021   Mild uncontrolled, goal ldl < 70, pt to start crestor 10

## 2021-03-11 NOTE — Assessment & Plan Note (Signed)
Lab Results  Component Value Date   HGBA1C 6.7 (H) 03/08/2021   New worsening with increased wt, will hold on OHA start for now, but for referral DM education

## 2021-03-12 ENCOUNTER — Ambulatory Visit
Admission: RE | Admit: 2021-03-12 | Discharge: 2021-03-12 | Disposition: A | Discharge status: Home / Self Care | Payer: BC Managed Care – PPO | Source: Ambulatory Visit

## 2021-03-12 DIAGNOSIS — Z1231 Encounter for screening mammogram for malignant neoplasm of breast: Secondary | ICD-10-CM

## 2021-06-08 ENCOUNTER — Telehealth: Payer: Self-pay | Admitting: Internal Medicine

## 2021-06-08 MED ORDER — METOPROLOL TARTRATE 25 MG PO TABS: ORAL_TABLET | ORAL | 3 refills | Status: DC

## 2021-06-08 NOTE — Telephone Encounter (Signed)
Patient needs metopralol  25 mg.  Sent to [facility] and [facility]

## 2021-09-19 ENCOUNTER — Other Ambulatory Visit: Payer: Self-pay | Admitting: Obstetrics & Gynecology

## 2021-09-19 DIAGNOSIS — Z1231 Encounter for screening mammogram for malignant neoplasm of breast: Secondary | ICD-10-CM

## 2021-12-12 ENCOUNTER — Encounter: Payer: Self-pay | Admitting: Obstetrics & Gynecology

## 2021-12-12 ENCOUNTER — Ambulatory Visit (INDEPENDENT_AMBULATORY_CARE_PROVIDER_SITE_OTHER): Payer: BC Managed Care – PPO | Admitting: Obstetrics & Gynecology

## 2021-12-12 VITALS — Ht 59.75 in | Wt 200.0 lb

## 2021-12-12 DIAGNOSIS — N898 Other specified noninflammatory disorders of vagina: Secondary | ICD-10-CM

## 2021-12-12 DIAGNOSIS — Z01419 Encounter for gynecological examination (general) (routine) without abnormal findings: Secondary | ICD-10-CM | POA: Diagnosis not present

## 2021-12-12 DIAGNOSIS — Z23 Encounter for immunization: Secondary | ICD-10-CM | POA: Diagnosis not present

## 2021-12-12 DIAGNOSIS — R350 Frequency of micturition: Secondary | ICD-10-CM | POA: Diagnosis not present

## 2021-12-12 DIAGNOSIS — N951 Menopausal and female climacteric states: Secondary | ICD-10-CM

## 2021-12-12 LAB — WET PREP FOR TRICH, YEAST, CLUE

## 2021-12-12 LAB — URINALYSIS, COMPLETE W/RFL CULTURE
Bacteria, UA: NONE SEEN /HPF
Bilirubin Urine: NEGATIVE
Glucose, UA: NEGATIVE
Hgb urine dipstick: NEGATIVE
Hyaline Cast: NONE SEEN /LPF
Ketones, ur: NEGATIVE
Leukocyte Esterase: NEGATIVE
Nitrites, Initial: NEGATIVE
Protein, ur: NEGATIVE
RBC / HPF: NONE SEEN /HPF (ref 0–2)
Specific Gravity, Urine: 1.015 (ref 1.001–1.035)
WBC, UA: NONE SEEN /HPF (ref 0–5)
pH: 7.5 (ref 5.0–8.0)

## 2021-12-12 LAB — NO CULTURE INDICATED

## 2021-12-12 NOTE — Progress Notes (Signed)
[name] 10-30-1970 [id number]   History:    51 y.o.  [place].  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  Olevia Bowens is sophomore in [place].   RP:  Established patient presenting for annual gyn exam    HPI:  Oligomenorrhea about every 2-3 months.  No pelvic pain. Intermittent mild hot flushes and night sweats.  Abstinent as fiance has ED.  Pap 10/2019 Neg.  Pap reflex today.  Increased vaginal d/c.  Urinary frequency, no other UTI Sx. BMs normal. Breasts normal. Mammo Neg 02/2021.  BMI 39.39. Walking. Health labs with Fam MD. Harriet Masson 12/2018 Benign polyp.  Flu vaccine done here today.  Past medical history,surgical history, family history and social history were all reviewed and documented in the EPIC chart.  Gynecologic History No LMP recorded. (Menstrual status: Irregular Periods).  Obstetric History OB History  Gravida Para Term Preterm AB Living  2 2 0     2  SAB IAB Ectopic Multiple Live Births               # Outcome Date GA Lbr Len/2nd Weight Sex Delivery Anes PTL Lv  2 Para     F CS-Unspec     1 Para     M CS-Unspec        ROS: A ROS was performed and pertinent positives and negatives are included in the history. GENERAL: No fevers or chills. HEENT: No change in vision, no earache, sore throat or sinus congestion. NECK: No pain or stiffness. CARDIOVASCULAR: No chest pain or pressure. No palpitations. PULMONARY: No shortness of breath, cough or wheeze. GASTROINTESTINAL: No abdominal pain, nausea, vomiting or diarrhea, melena or bright red blood per rectum. GENITOURINARY: No urinary frequency, urgency, hesitancy or dysuria. MUSCULOSKELETAL: No joint or muscle pain, no back pain, no recent trauma. DERMATOLOGIC: No rash, no itching, no lesions. ENDOCRINE: No polyuria, polydipsia, no heat or cold intolerance. No recent change in weight. HEMATOLOGICAL: No anemia or easy bruising or bleeding. NEUROLOGIC: No headache, seizures, numbness, tingling or  weakness. PSYCHIATRIC: No depression, no loss of interest in normal activity or change in sleep pattern.     Exam:   Ht 4' 11.75" (1.518 m)   Wt 200 lb (90.7 kg)   BMI 39.39 kg/m   Body mass index is 39.39 kg/m.  General appearance : Well developed well nourished female. No acute distress HEENT: Eyes: no retinal hemorrhage or exudates,  Neck supple, trachea midline, no carotid bruits, no thyroidmegaly Lungs: Clear to auscultation, no rhonchi or wheezes, or rib retractions  Heart: Regular rate and rhythm, no murmurs or gallops Breast:Examined in sitting and supine position were symmetrical in appearance, no palpable masses or tenderness,  no skin retraction, no nipple inversion, no nipple discharge, no skin discoloration, no axillary or supraclavicular lymphadenopathy Abdomen: no palpable masses or tenderness, no rebound or guarding Extremities: no edema or skin discoloration or tenderness  Pelvic: Vulva: Normal             Vagina: No gross lesions.  Increased discharge.  Wet prep done.  Cervix: No gross lesions.  Pap reflex done.  Uterus  AV, normal size, shape and consistency, non-tender and mobile  Adnexa  Without masses or tenderness  Anus: Normal  U/A completely Negative Wet prep: Negative   Assessment/Plan:  51 y.o. female for annual exam   1. Encounter for routine gynecological examination with Papanicolaou smear of cervix Oligomenorrhea about every 2-3 months.  No pelvic pain.  Intermittent mild hot flushes and night sweats.  Abstinent as fiance has ED.  Pap 10/2019 Neg.  Pap reflex today.  Increased vaginal d/c.  Urinary frequency, no other UTI Sx. BMs normal. Breasts normal. Mammo Neg 02/2021.  BMI 39.39. Walking. Health labs with Fam MD. Harriet Masson 12/2018 Benign polyp.  Flu vaccine done here today. - Cytology - PAP( [facility])  2. Perimenopause Oligomenorrhea about every 2-3 months.  No pelvic pain. Intermittent mild hot flushes and night sweats.  Abstinent as fiance  has ED. Abnormal uterine bleeding precautions reviewed.  3. Vaginal discharge Wet prep neg, patient reassured. - WET PREP FOR [place], YEAST, CLUE  4. Urinary frequency U/A completely neg, reassured. - Urinalysis,Complete w/RFL Culture  5. Need for immunization against influenza - Flu Vaccine QUAD 29moIM (Fluarix, Fluzone & Alfiuria Quad PF)  Other orders - REFLEXIVE URINE CULTURE   MPrincess BruinsMD, 1:38 PM 12/12/2021

## 2021-12-18 LAB — CYTOLOGY - PAP: Diagnosis: NEGATIVE

## 2021-12-23 ENCOUNTER — Encounter: Payer: Self-pay | Admitting: Obstetrics & Gynecology

## 2022-01-08 ENCOUNTER — Other Ambulatory Visit: Payer: Self-pay | Admitting: *Deleted

## 2022-01-08 DIAGNOSIS — R87618 Other abnormal cytological findings on specimens from cervix uteri: Secondary | ICD-10-CM

## 2022-02-12 ENCOUNTER — Encounter: Payer: Self-pay | Admitting: Obstetrics & Gynecology

## 2022-02-12 ENCOUNTER — Other Ambulatory Visit (HOSPITAL_COMMUNITY)
Admission: RE | Admit: 2022-02-12 | Discharge: 2022-02-12 | Disposition: A | Discharge status: Home / Self Care | Payer: BC Managed Care – PPO | Source: Ambulatory Visit | Attending: Obstetrics & Gynecology | Admitting: Obstetrics & Gynecology

## 2022-02-12 ENCOUNTER — Ambulatory Visit: Payer: BC Managed Care – PPO | Admitting: Obstetrics & Gynecology

## 2022-02-12 VITALS — BP 110/80 | HR 90

## 2022-02-12 DIAGNOSIS — R87618 Other abnormal cytological findings on specimens from cervix uteri: Secondary | ICD-10-CM

## 2022-02-12 DIAGNOSIS — Z01812 Encounter for preprocedural laboratory examination: Secondary | ICD-10-CM

## 2022-02-12 LAB — PREGNANCY, URINE: Preg Test, Ur: NEGATIVE

## 2022-02-12 NOTE — Progress Notes (Signed)
    Fedra Lanter Speigner Jun 10, 1970 [id number]        52 y.o.  G2P2L2   RP: Endometrial cells present of Pap test for EBx  HPI: Endometrial cells present of Pap test 12/12/21.  Oligomenorrhea of perimenopause.  Light bleeding 10/2021, no bleeding since then.  Abstinence.   OB History  Gravida Para Term Preterm AB Living  2 2 0     2  SAB IAB Ectopic Multiple Live Births               # Outcome Date GA Lbr Len/2nd Weight Sex Delivery Anes PTL Lv  2 Para     F CS-Unspec     1 Para     M CS-Unspec       Past medical history,surgical history, problem list, medications, allergies, family history and social history were all reviewed and documented in the EPIC chart.   Directed ROS with pertinent positives and negatives documented in the history of present illness/assessment and plan.  Exam:  Vitals:   02/12/22 1604  BP: 110/80  Pulse: 90  SpO2: 99%   General appearance:  Normal  Written consent written for EBx.  EBx procedure:  Speculum:  Cervix normal.  Betadine prep and Hurricane spray on the cervix.  Tenaculum on anterior lip of cervix.  Os finder.  EBx on all endometrial surfaces with negative pressure.  Mild specimen obtained, sent to pathology.  Pipette removed.  Tenaculum removed.  Silver Nitrate for hemostasis at site of Tenaculum.  Speculum removed.  Well tolerated.  No Cx.  UPT Neg   Assessment/Plan:  52 y.o. G2P0002   1. Unexplained endometrial cells on cervical Pap smear Endometrial cells present of Pap test 12/12/21.  EBx done without difficulty or Cx.  Well tolerated with a uterine cramp.  Post procedure precautions.  Management per results. - Endometrial biopsy - Surgical pathology( / [facility])  2. Encounter for preprocedural laboratory examination UPT Neg - Pregnancy, urine   Princess Bruins MD, 4:06 PM 02/12/2022

## 2022-02-15 LAB — SURGICAL PATHOLOGY

## 2022-03-13 ENCOUNTER — Ambulatory Visit
Admission: RE | Admit: 2022-03-13 | Discharge: 2022-03-13 | Disposition: A | Discharge status: Home / Self Care | Payer: BC Managed Care – PPO | Source: Ambulatory Visit | Attending: Obstetrics & Gynecology | Admitting: Obstetrics & Gynecology

## 2022-03-13 DIAGNOSIS — Z1231 Encounter for screening mammogram for malignant neoplasm of breast: Secondary | ICD-10-CM

## 2022-07-15 ENCOUNTER — Encounter: Payer: Self-pay | Admitting: Internal Medicine

## 2022-07-16 ENCOUNTER — Other Ambulatory Visit: Payer: Self-pay

## 2022-07-16 MED ORDER — METOPROLOL TARTRATE 25 MG PO TABS: ORAL_TABLET | ORAL | 0 refills | Status: DC

## 2022-08-12 ENCOUNTER — Encounter: Payer: BC Managed Care – PPO | Admitting: Internal Medicine

## 2022-08-14 ENCOUNTER — Encounter: Payer: Self-pay | Admitting: Internal Medicine

## 2022-08-14 ENCOUNTER — Ambulatory Visit (INDEPENDENT_AMBULATORY_CARE_PROVIDER_SITE_OTHER): Payer: BC Managed Care – PPO | Admitting: Internal Medicine

## 2022-08-14 VITALS — BP 126/78 | HR 72 | Temp 98.1°F | Ht 59.75 in | Wt 206.0 lb

## 2022-08-14 DIAGNOSIS — R21 Rash and other nonspecific skin eruption: Secondary | ICD-10-CM | POA: Diagnosis not present

## 2022-08-14 DIAGNOSIS — E785 Hyperlipidemia, unspecified: Secondary | ICD-10-CM | POA: Diagnosis not present

## 2022-08-14 DIAGNOSIS — E538 Deficiency of other specified B group vitamins: Secondary | ICD-10-CM | POA: Diagnosis not present

## 2022-08-14 DIAGNOSIS — Z7985 Long-term (current) use of injectable non-insulin antidiabetic drugs: Secondary | ICD-10-CM | POA: Diagnosis not present

## 2022-08-14 DIAGNOSIS — E559 Vitamin D deficiency, unspecified: Secondary | ICD-10-CM | POA: Diagnosis not present

## 2022-08-14 DIAGNOSIS — Z0001 Encounter for general adult medical examination with abnormal findings: Secondary | ICD-10-CM | POA: Diagnosis not present

## 2022-08-14 DIAGNOSIS — E1165 Type 2 diabetes mellitus with hyperglycemia: Secondary | ICD-10-CM

## 2022-08-14 DIAGNOSIS — I10 Essential (primary) hypertension: Secondary | ICD-10-CM

## 2022-08-14 LAB — URINALYSIS, ROUTINE W REFLEX MICROSCOPIC
Bilirubin Urine: NEGATIVE
Hgb urine dipstick: NEGATIVE
Ketones, ur: NEGATIVE
Nitrite: NEGATIVE
Specific Gravity, Urine: 1.01 (ref 1.000–1.030)
Total Protein, Urine: NEGATIVE
Urine Glucose: NEGATIVE
Urobilinogen, UA: 0.2 (ref 0.0–1.0)
pH: 6 (ref 5.0–8.0)

## 2022-08-14 LAB — CBC WITH DIFFERENTIAL/PLATELET
Basophils Absolute: 0.1 10*3/uL (ref 0.0–0.1)
Basophils Relative: 0.9 % (ref 0.0–3.0)
Eosinophils Absolute: 0.7 10*3/uL (ref 0.0–0.7)
Eosinophils Relative: 7.7 % — ABNORMAL HIGH (ref 0.0–5.0)
HCT: 40 % (ref 36.0–46.0)
Hemoglobin: 13.3 g/dL (ref 12.0–15.0)
Lymphocytes Relative: 21.1 % (ref 12.0–46.0)
Lymphs Abs: 1.8 10*3/uL (ref 0.7–4.0)
MCHC: 33.3 g/dL (ref 30.0–36.0)
MCV: 88.9 fl (ref 78.0–100.0)
Monocytes Absolute: 0.6 10*3/uL (ref 0.1–1.0)
Monocytes Relative: 6.9 % (ref 3.0–12.0)
Neutro Abs: 5.6 10*3/uL (ref 1.4–7.7)
Neutrophils Relative %: 63.4 % (ref 43.0–77.0)
Platelets: 288 10*3/uL (ref 150.0–400.0)
RBC: 4.5 Mil/uL (ref 3.87–5.11)
RDW: 13.1 % (ref 11.5–15.5)
WBC: 8.8 10*3/uL (ref 4.0–10.5)

## 2022-08-14 LAB — LIPID PANEL
Cholesterol: 191 mg/dL (ref 0–200)
HDL: 55.3 mg/dL (ref >39.00)
LDL Cholesterol: 115 mg/dL — ABNORMAL HIGH (ref 0–99)
NonHDL: 135.33
Total CHOL/HDL Ratio: 3
Triglycerides: 101 mg/dL (ref 0.0–149.0)
VLDL: 20.2 mg/dL (ref 0.0–40.0)

## 2022-08-14 LAB — MICROALBUMIN / CREATININE URINE RATIO
Creatinine,U: 68.2 mg/dL
Microalb Creat Ratio: 1 mg/g (ref 0.0–30.0)
Microalb, Ur: <0.7 mg/dL (ref 0.0–1.9)

## 2022-08-14 LAB — HEPATIC FUNCTION PANEL
ALT: 30 U/L (ref 0–35)
AST: 23 U/L (ref 0–37)
Albumin: 4.2 g/dL (ref 3.5–5.2)
Alkaline Phosphatase: 55 U/L (ref 39–117)
Bilirubin, Direct: 0.1 mg/dL (ref 0.0–0.3)
Total Bilirubin: 0.4 mg/dL (ref 0.2–1.2)
Total Protein: 7.2 g/dL (ref 6.0–8.3)

## 2022-08-14 LAB — BASIC METABOLIC PANEL
BUN: 12 mg/dL (ref 6–23)
CO2: 29 mEq/L (ref 19–32)
Calcium: 9.7 mg/dL (ref 8.4–10.5)
Chloride: 100 mEq/L (ref 96–112)
Creatinine, Ser: 0.62 mg/dL (ref 0.40–1.20)
GFR: 102.67 mL/min (ref >60.00)
Glucose, Bld: 140 mg/dL — ABNORMAL HIGH (ref 70–99)
Potassium: 3.8 mEq/L (ref 3.5–5.1)
Sodium: 139 mEq/L (ref 135–145)

## 2022-08-14 LAB — VITAMIN D 25 HYDROXY (VIT D DEFICIENCY, FRACTURES): VITD: 28.59 ng/mL — ABNORMAL LOW (ref 30.00–100.00)

## 2022-08-14 LAB — HEMOGLOBIN A1C: Hgb A1c MFr Bld: 6.9 % — ABNORMAL HIGH (ref 4.6–6.5)

## 2022-08-14 LAB — VITAMIN B12: Vitamin B-12: 835 pg/mL (ref 211–911)

## 2022-08-14 LAB — TSH: TSH: 2.51 u[IU]/mL (ref 0.35–5.50)

## 2022-08-14 MED ORDER — TRIAMCINOLONE ACETONIDE 0.1 % EX CREA: 1.0000 | TOPICAL_CREAM | Freq: Two times a day (BID) | CUTANEOUS | 0 refills | Status: AC

## 2022-08-14 NOTE — Assessment & Plan Note (Signed)
Mid abd irritation etiology unclear c/w dermatiits inflammatory very mild - for triam cr prn

## 2022-08-14 NOTE — Patient Instructions (Addendum)
Please have your Shingrix (shingles) shots done at your local pharmacy.  Please take all new medication as prescribed  - the steroid cream as needed for the rash  Please continue all other medications as before, and refills have been done if requested.  Please have the pharmacy call with any other refills you may need.  Please continue your efforts at being more active, low cholesterol diet, and weight control.  You are otherwise up to date with prevention measures today.  Please keep your appointments with your specialists as you may have planned  Please go to the LAB at the blood drawing area for the tests to be done  You will be contacted by phone if any changes need to be made immediately.  Otherwise, you will receive a letter about your results with an explanation, but please check with MyChart first.  Please make an Appointment to return in 6 months, or sooner if needed, to recheck the sugar

## 2022-08-14 NOTE — Assessment & Plan Note (Signed)
Lab Results  Component Value Date   HGBA1C 6.9 (H) 08/14/2022   Mild worsening new osnet DM, for diet, and consider start mounjaro 2.5 mg weekl - pt to call if decldes to start this

## 2022-08-14 NOTE — Progress Notes (Signed)
Patient ID: [name], female   DOB: 1970/11/20, 52 y.o.   MRN: [id number]         Chief Complaint:: wellness exam and dm, hld, low vit d, rash       HPI:  [name] is a 52 y.o. female here for wellness exam; pt plans to call for eye exam for shingrix at pharmacy, declines covid booster, o/w up to date                        Also Pt denies chest pain, increased sob or doe, wheezing, orthopnea, PND, increased LE swelling, palpitations, dizziness or syncope.   Pt denies polydipsia, polyuria, or new focal neuro s/s.    Pt denies fever, wt loss, night sweats, loss of appetite, or other constitutional symptoms  Not taking crestor as was concerned about possible side effect.  Might be interested in mounjaro.  Not taking Vit D.  Has a new 1 wk itchy rash to abd in lines  Pt denies chest pain, increased sob or doe, wheezing, orthopnea, PND, increased LE swelling, palpitations, dizziness or syncope.   Pt denies polydipsia, polyuria, or new focal neuro s/s.    Pt denies fever, wt loss, night sweats, loss of appetite, or other constitutional symptoms     Wt Readings from Last 3 Encounters:  08/14/22 206 lb (93.4 kg)  12/12/21 200 lb (90.7 kg)  03/07/21 202 lb 12.8 oz (92 kg)   BP Readings from Last 3 Encounters:  08/14/22 126/78  02/12/22 110/80  03/07/21 122/84   Immunization History  Administered Date(s) Administered   Influenza Split 10/16/2011   Influenza Whole 11/05/2007   Influenza, Seasonal, Injecte, Preservative Fre 10/02/2012   Influenza,inj,Quad PF,6+ Mos 09/29/2012, 10/31/2016, 10/15/2017, 10/22/2018, 11/15/2019, 11/28/2020, 12/12/2021   Influenza-Unspecified 11/30/2013   PFIZER(Purple Top)SARS-COV-2 Vaccination 03/22/2019, 04/19/2019, 01/10/2020   Td 06/21/2008   Tdap 03/13/2020   Health Maintenance Due  Topic Date Due   OPHTHALMOLOGY EXAM  Never done   Zoster Vaccines- Shingrix (1 of 2) Never done      Past Medical History:  Diagnosis Date   ALLERGIC RHINITIS     ANXIETY    GERD    Gestational diabetes    HYPERLIPIDEMIA    Impaired glucose tolerance    Tachycardia    Past Surgical History:  Procedure Laterality Date   CESAREAN SECTION  2003/2008   x 2   NASAL SINUS SURGERY  1993   TONSILLECTOMY AND ADENOIDECTOMY      reports that she has never smoked. She has never used smokeless tobacco. She reports current alcohol use. She reports that she does not use drugs. family history includes Atrial fibrillation in her mother; Breast cancer (age of onset: 50) in her mother; Colon polyps in her father and mother; Diabetes in her father; Heart disease in her father; Liver disease in her father; Parkinson's disease in her mother. Allergies  Allergen Reactions   Ciprofloxacin Rash   Sulfonamide Derivatives Rash   Current Outpatient Medications on File Prior to Visit  Medication Sig Dispense Refill   clobetasol (TEMOVATE) 0.05 % external solution Apply topically.     IBUPROFEN PO Take by mouth as needed.     metoprolol tartrate (LOPRESSOR) 25 MG tablet TAKE 1 TABLET BY MOUTH DAILY 30 tablet 0   Multiple Vitamin (MULTIVITAMIN) capsule Take 1 capsule by mouth daily.     No current facility-administered medications on file prior to visit.  ROS:  All others reviewed and negative.  Objective        PE:  BP 126/78 (BP Location: Right Arm, Patient Position: Sitting, Cuff Size: Normal)   Pulse 72   Temp 98.1 F (36.7 C) (Oral)   Ht 4' 11.75" (1.518 m)   Wt 206 lb (93.4 kg)   SpO2 98%   BMI 40.57 kg/m                 Constitutional: Pt appears in NAD               HENT: Head: NCAT.                Right Ear: External ear normal.                 Left Ear: External ear normal.                Eyes: . Pupils are equal, round, and reactive to light. Conjunctivae and EOM are normal               Nose: without d/c or deformity               Neck: Neck supple. Gross normal ROM               Cardiovascular: Normal rate and regular rhythm.                  Pulmonary/Chest: Effort normal and breath sounds without rales or wheezing.                Abd:  Soft, NT, ND, + BS, no organomegaly               Neurological: Pt is alert. At baseline orientation, motor grossly intact               Skin: Skin is warm.  LE edema - none, mid abd wall rash with linear lesions nontender               Psychiatric: Pt behavior is normal without agitation   Micro: none  Cardiac tracings I have personally interpreted today:  none  Pertinent Radiological findings (summarize): none   Lab Results  Component Value Date   WBC 8.8 08/14/2022   HGB 13.3 08/14/2022   HCT 40.0 08/14/2022   PLT 288.0 08/14/2022   GLUCOSE 140 (H) 08/14/2022   CHOL 191 08/14/2022   TRIG 101.0 08/14/2022   HDL 55.30 08/14/2022   LDLDIRECT 142.5 09/01/2006   LDLCALC 115 (H) 08/14/2022   ALT 30 08/14/2022   AST 23 08/14/2022   NA 139 08/14/2022   K 3.8 08/14/2022   CL 100 08/14/2022   CREATININE 0.62 08/14/2022   BUN 12 08/14/2022   CO2 29 08/14/2022   TSH 2.51 08/14/2022   HGBA1C 6.9 (H) 08/14/2022   MICROALBUR <0.7 08/14/2022   Assessment/Plan:  [name] is a 52 y.o. White or Caucasian [1] female with  has a past medical history of ALLERGIC RHINITIS, ANXIETY, GERD, Gestational diabetes, HYPERLIPIDEMIA, Impaired glucose tolerance, and Tachycardia.  Rash Mid abd irritation etiology unclear c/w dermatiits inflammatory very mild - for triam cr prn  Encounter for well adult exam with abnormal findings Age and sex appropriate education and counseling updated with regular exercise and diet Referrals for preventative services - pt to call for yearly eye exam,   Immunizations addressed - declines covid booster, for shingrix at pharmacy Smoking counseling  - none  needed Evidence for depression or other mood disorder - none significant Most recent labs reviewed. I have personally reviewed and have noted: 1) the patient's medical and social history 2) The  patient's current medications and supplements 3) The patient's height, weight, and BMI have been recorded in the chart   Diabetes [facility]) Lab Results  Component Value Date   HGBA1C 6.9 (H) 08/14/2022   Mild worsening new osnet DM, for diet, and consider start mounjaro 2.5 mg weekl - pt to call if decldes to start this   HTN (hypertension) BP Readings from Last 3 Encounters:  08/14/22 126/78  02/12/22 110/80  03/07/21 122/84   Stable, pt to continue medical treatment lopressor 25 qd   Hyperlipidemia Lab Results  Component Value Date   LDLCALC 115 (H) 08/14/2022   Uncontrolled, for lower chol diet,, pt declines statin for now   Vitamin D deficiency Last vitamin D Lab Results  Component Value Date   VD25OH 28.59 (L) 08/14/2022   Low, to start oral replacement  Followup: Return in about 6 months (around 02/14/2023).  Oliver Barre, MD 08/14/2022 9:23 PM [facility]

## 2022-08-14 NOTE — Assessment & Plan Note (Signed)
Lab Results  Component Value Date   LDLCALC 115 (H) 08/14/2022   Uncontrolled, for lower chol diet,, pt declines statin for now

## 2022-08-14 NOTE — Assessment & Plan Note (Signed)
BP Readings from Last 3 Encounters:  08/14/22 126/78  02/12/22 110/80  03/07/21 122/84   Stable, pt to continue medical treatment lopressor 25 qd

## 2022-08-14 NOTE — Assessment & Plan Note (Signed)
Age and sex appropriate education and counseling updated with regular exercise and diet Referrals for preventative services - pt to call for yearly eye exam,   Immunizations addressed - declines covid booster, for shingrix at pharmacy Smoking counseling  - none needed Evidence for depression or other mood disorder - none significant Most recent labs reviewed. I have personally reviewed and have noted: 1) the patient's medical and social history 2) The patient's current medications and supplements 3) The patient's height, weight, and BMI have been recorded in the chart

## 2022-08-14 NOTE — Assessment & Plan Note (Signed)
Last vitamin D Lab Results  Component Value Date   VD25OH 28.59 (L) 08/14/2022   Low, to start oral replacement

## 2022-08-23 ENCOUNTER — Other Ambulatory Visit: Payer: Self-pay | Admitting: Internal Medicine

## 2022-08-28 ENCOUNTER — Encounter: Payer: Self-pay | Admitting: Internal Medicine

## 2022-08-29 NOTE — Telephone Encounter (Signed)
Noted../lmb 

## 2022-09-23 ENCOUNTER — Other Ambulatory Visit: Payer: Self-pay | Admitting: Internal Medicine

## 2022-09-24 ENCOUNTER — Other Ambulatory Visit: Payer: Self-pay

## 2022-12-18 ENCOUNTER — Ambulatory Visit (INDEPENDENT_AMBULATORY_CARE_PROVIDER_SITE_OTHER): Payer: BC Managed Care – PPO | Admitting: Obstetrics and Gynecology

## 2022-12-18 ENCOUNTER — Encounter: Payer: Self-pay | Admitting: Obstetrics and Gynecology

## 2022-12-18 ENCOUNTER — Other Ambulatory Visit (HOSPITAL_COMMUNITY)
Admission: RE | Admit: 2022-12-18 | Discharge: 2022-12-18 | Disposition: A | Discharge status: Home / Self Care | Payer: BC Managed Care – PPO | Source: Ambulatory Visit | Attending: Obstetrics and Gynecology | Admitting: Obstetrics and Gynecology

## 2022-12-18 VITALS — BP 112/76 | HR 102 | Ht 60.24 in | Wt 200.0 lb

## 2022-12-18 DIAGNOSIS — Z23 Encounter for immunization: Secondary | ICD-10-CM

## 2022-12-18 DIAGNOSIS — E661 Drug-induced obesity: Secondary | ICD-10-CM

## 2022-12-18 DIAGNOSIS — Z01419 Encounter for gynecological examination (general) (routine) without abnormal findings: Secondary | ICD-10-CM | POA: Diagnosis not present

## 2022-12-18 DIAGNOSIS — Z124 Encounter for screening for malignant neoplasm of cervix: Secondary | ICD-10-CM | POA: Insufficient documentation

## 2022-12-18 NOTE — Patient Instructions (Signed)
For patients under 50-52yo, I recommend 1200mg  calcium daily and 600IU of vitamin D daily. For patients over 52yo, I recommend 1200mg  calcium daily and 800IU of vitamin D daily.  Health Maintenance, Female Adopting a healthy lifestyle and getting preventive care are important in promoting health and wellness. Ask your health care provider about: The right schedule for you to have regular tests and exams. Things you can do on your own to prevent diseases and keep yourself healthy. What should I know about diet, weight, and exercise? Eat a healthy diet  Eat a diet that includes plenty of vegetables, fruits, low-fat dairy products, and lean protein. Do not eat a lot of foods that are high in solid fats, added sugars, or sodium. Maintain a healthy weight Body mass index (BMI) is used to identify weight problems. It estimates body fat based on height and weight. Your health care provider can help determine your BMI and help you achieve or maintain a healthy weight. Get regular exercise Get regular exercise. This is one of the most important things you can do for your health. Most adults should: Exercise for at least 150 minutes each week. The exercise should increase your heart rate and make you sweat (moderate-intensity exercise). Do strengthening exercises at least twice a week. This is in addition to the moderate-intensity exercise. Spend less time sitting. Even light physical activity can be beneficial. Watch cholesterol and blood lipids Have your blood tested for lipids and cholesterol at 52 years of age, then have this test every 5 years. Have your cholesterol levels checked more often if: Your lipid or cholesterol levels are high. You are older than 52 years of age. You are at high risk for heart disease. What should I know about cancer screening? Depending on your health history and family history, you may need to have cancer screening at various ages. This may include screening  for: Breast cancer. Cervical cancer. Colorectal cancer. Skin cancer. Lung cancer. What should I know about heart disease, diabetes, and high blood pressure? Blood pressure and heart disease High blood pressure causes heart disease and increases the risk of stroke. This is more likely to develop in people who have high blood pressure readings or are overweight. Have your blood pressure checked: Every 3-5 years if you are 83-52 years of age. Every year if you are 4 years old or older. Diabetes Have regular diabetes screenings. This checks your fasting blood sugar level. Have the screening done: Once every three years after age 68 if you are at a normal weight and have a low risk for diabetes. More often and at a younger age if you are overweight or have a high risk for diabetes. What should I know about preventing infection? Hepatitis B If you have a higher risk for hepatitis B, you should be screened for this virus. Talk with your health care provider to find out if you are at risk for hepatitis B infection. Hepatitis C Testing is recommended for: Everyone born from [birth year] through 1965. Anyone with known risk factors for hepatitis C. Sexually transmitted infections (STIs) Get screened for STIs, including gonorrhea and chlamydia, if: You are sexually active and are younger than 52 years of age. You are older than 52 years of age and your health care provider tells you that you are at risk for this type of infection. Your sexual activity has changed since you were last screened, and you are at increased risk for chlamydia or gonorrhea. Ask your health care provider if  you are at risk. Ask your health care provider about whether you are at high risk for HIV. Your health care provider may recommend a prescription medicine to help prevent HIV infection. If you choose to take medicine to prevent HIV, you should first get tested for HIV. You should then be tested every 3 months for as long as you  are taking the medicine. Osteoporosis and menopause Osteoporosis is a disease in which the bones lose minerals and strength with aging. This can result in bone fractures. If you are 44 years old or older, or if you are at risk for osteoporosis and fractures, ask your health care provider if you should: Be screened for bone loss. Take a calcium or vitamin D supplement to lower your risk of fractures. Be given hormone replacement therapy (HRT) to treat symptoms of menopause. Follow these instructions at home: Alcohol use Do not drink alcohol if: Your health care provider tells you not to drink. You are pregnant, may be pregnant, or are planning to become pregnant. If you drink alcohol: Limit how much you have to: 0-1 drink a day. Know how much alcohol is in your drink. In the [place] one drink equals one 12 oz bottle of beer (355 mL), one 5 oz glass of wine (148 mL), or one 1 oz glass of hard liquor (44 mL). Lifestyle Do not use any products that contain nicotine or tobacco. These products include cigarettes, chewing tobacco, and vaping devices, such as e-cigarettes. If you need help quitting, ask your health care provider. Do not use street drugs. Do not share needles. Ask your health care provider for help if you need support or information about quitting drugs. General instructions Schedule regular health, dental, and eye exams. Stay current with your vaccines. Tell your health care provider if: You often feel depressed. You have ever been abused or do not feel safe at home. Summary Adopting a healthy lifestyle and getting preventive care are important in promoting health and wellness. Follow your health care provider's instructions about healthy diet, exercising, and getting tested or screened for diseases. Follow your health care provider's instructions on monitoring your cholesterol and blood pressure. This information is not intended to replace advice given to you by your health  care provider. Make sure you discuss any questions you have with your health care provider. Document Revised: 05/29/2020 Document Reviewed: 05/29/2020 Elsevier Patient Education  2024 [employer].

## 2022-12-18 NOTE — Assessment & Plan Note (Signed)
Encouraged regular exercise especially strength training.

## 2022-12-18 NOTE — Assessment & Plan Note (Signed)
Cervical cancer screening performed according to ASCCP guidelines. Encouraged annual mammogram screening Colonoscopy 2020, q7y DXA N/A Labs and immunizations with her primary Encouraged safe sexual practices as indicated Encouraged healthy lifestyle practices with diet and exercise For patients under 50-52yo, I recommend 1200mg  calcium daily and 600IU of vitamin D daily.

## 2022-12-18 NOTE — Progress Notes (Signed)
52 y.o. [id number] female here for annual exam. 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  Pt states she would like flu vaccine today.  Last menstrual period October 2023. No bleeding at all this year.  Pelvic discharge or pain: none Breast mass, nipple discharge or skin changes : none Birth control: abstinence Last PAP:     Component Value Date/Time   DIAGPAP - Endometrial cells present 12/12/2021 1356   DIAGPAP  12/12/2021 1356    - Negative for Intraepithelial Lesions or Malignancy (NILM)   ADEQPAP  12/12/2021 1356    Satisfactory for evaluation; transformation zone component PRESENT.   Last mammogram: 03/13/22 BIRADS 1, density b Last colonoscopy: 01/01/2019, benign polyp, q16yr Sexually active: no  Exercising: yes, walks 2-3d per week Smoker: no  GYN HISTORY: No significant history  OB History  Gravida Para Term Preterm AB Living  2 2 0     2  SAB IAB Ectopic Multiple Live Births               # Outcome Date GA Lbr Len/2nd Weight Sex Type Anes PTL Lv  2 Para     F CS-Unspec     1 Para     M CS-Unspec       Past Medical History:  Diagnosis Date   ALLERGIC RHINITIS    ANXIETY    GERD    Gestational diabetes    HYPERLIPIDEMIA    Impaired glucose tolerance    Tachycardia     Past Surgical History:  Procedure Laterality Date   CESAREAN SECTION  2003/2008   x 2   NASAL SINUS SURGERY  1993   TONSILLECTOMY AND ADENOIDECTOMY      Current Outpatient Medications on File Prior to Visit  Medication Sig Dispense Refill   clobetasol (TEMOVATE) 0.05 % external solution Apply topically.     IBUPROFEN PO Take by mouth as needed.     metoprolol tartrate (LOPRESSOR) 25 MG tablet Take 1 tablet by mouth once daily 90 tablet 3   Multiple Vitamin (MULTIVITAMIN) capsule Take 1 capsule by mouth daily.     triamcinolone cream (KENALOG) 0.1 % Apply 1 Application topically 2 (two) times daily. 30 g 0   No current facility-administered medications on file prior to visit.     Social History   Socioeconomic History   Marital status: Divorced    Spouse name: Not on file   Number of children: 2   Years of education: Not on file   Highest education level: Not on file  Occupational History   Occupation: TEACHER    Employer: [employer]  Tobacco Use   Smoking status: Never   Smokeless tobacco: Never  Vaping Use   Vaping status: Never Used  Substance and Sexual Activity   Alcohol use: Yes    Comment: occasional-2 per month   Drug use: No   Sexual activity: Not Currently    Partners: Male    Birth control/protection: Abstinence    Comment: 1ST INTERCOURSE- 26, PARTNERS- 3,   Other Topics Concern   Not on file  Social History Narrative   Not on file   Social Determinants of Health   Financial Resource Strain: Not on file  Food Insecurity: Not on file  Transportation Needs: Not on file  Physical Activity: Not on file  Stress: Not on file  Social Connections: Not on file  Intimate Partner Violence: Not on file    Family History  Problem Relation Age of Onset  Breast cancer Mother 60   Colon polyps Mother    Parkinson's disease Mother    Atrial fibrillation Mother    Colon polyps Father    Diabetes Father    Heart disease Father    Liver disease Father    Colon cancer Neg Hx    Esophageal cancer Neg Hx    Rectal cancer Neg Hx    Stomach cancer Neg Hx     Allergies  Allergen Reactions   Ciprofloxacin Rash   Sulfonamide Derivatives Rash      PE Today's Vitals   12/18/22 0758  BP: 112/76  Pulse: (!) 102  SpO2: 98%  Weight: 200 lb (90.7 kg)  Height: 5' 0.24" (1.53 m)   Body mass index is 38.75 kg/m.  Physical Exam Vitals reviewed. Exam conducted with a chaperone present.  Constitutional:      General: She is not in acute distress.    Appearance: Normal appearance.  HENT:     Head: Normocephalic and atraumatic.     Nose: Nose normal.  Eyes:     Extraocular Movements: Extraocular movements intact.      Conjunctiva/sclera: Conjunctivae normal.  Neck:     Thyroid: No thyroid mass, thyromegaly or thyroid tenderness.  Pulmonary:     Effort: Pulmonary effort is normal.  Chest:     Chest wall: No mass or tenderness.  Breasts:    Right: Normal. No swelling, mass, nipple discharge, skin change or tenderness.     Left: Normal. No swelling, mass, nipple discharge, skin change or tenderness.  Abdominal:     General: There is no distension.     Palpations: Abdomen is soft.     Tenderness: There is no abdominal tenderness.  Genitourinary:    General: Normal vulva.     Exam position: Lithotomy position.     Urethra: No prolapse.     Vagina: Normal. No vaginal discharge or bleeding.     Cervix: Normal. No lesion.     Uterus: Normal. Not enlarged and not tender.      Adnexa: Right adnexa normal and left adnexa normal.     Comments: Narrow pubic arch Musculoskeletal:        General: Normal range of motion.     Cervical back: Normal range of motion.  Lymphadenopathy:     Upper Body:     Right upper body: No axillary adenopathy.     Left upper body: No axillary adenopathy.     Lower Body: No right inguinal adenopathy. No left inguinal adenopathy.  Skin:    General: Skin is warm and dry.  Neurological:     General: No focal deficit present.     Mental Status: She is alert.  Psychiatric:        Mood and Affect: Mood normal.        Behavior: Behavior normal.       Assessment and Plan:        Cervical cancer screening -     Cytology - PAP  Well woman exam with routine gynecological exam Assessment & Plan: Cervical cancer screening performed according to ASCCP guidelines. Encouraged annual mammogram screening Colonoscopy 2020, q7y DXA N/A Labs and immunizations with her primary Encouraged safe sexual practices as indicated Encouraged healthy lifestyle practices with diet and exercise For patients under 50-70yo, I recommend 1200mg  calcium daily and 600IU of vitamin D  daily.    Class 2 drug-induced obesity with serious comorbidity and body mass index (BMI) of 38.0 to 38.9 in  adult Assessment & Plan: Encouraged regular exercise especially strength training.    Encounter for immunization -     Flu vaccine trivalent PF, 6mos and older(Flulaval,Afluria,Fluarix,Fluzone)    Rosalyn Gess, MD

## 2022-12-23 LAB — CYTOLOGY - PAP
Diagnosis: NEGATIVE
Diagnosis: REACTIVE

## 2023-01-28 ENCOUNTER — Other Ambulatory Visit: Payer: Self-pay | Admitting: Obstetrics and Gynecology

## 2023-01-28 DIAGNOSIS — Z1231 Encounter for screening mammogram for malignant neoplasm of breast: Secondary | ICD-10-CM

## 2023-02-14 ENCOUNTER — Ambulatory Visit: Payer: BC Managed Care – PPO | Admitting: Internal Medicine

## 2023-03-17 ENCOUNTER — Ambulatory Visit
Admission: RE | Admit: 2023-03-17 | Discharge: 2023-03-17 | Disposition: A | Discharge status: Home / Self Care | Payer: 59 | Source: Ambulatory Visit | Attending: Obstetrics and Gynecology

## 2023-03-17 DIAGNOSIS — Z1231 Encounter for screening mammogram for malignant neoplasm of breast: Secondary | ICD-10-CM

## 2023-03-24 ENCOUNTER — Other Ambulatory Visit: Payer: Self-pay

## 2023-03-24 ENCOUNTER — Encounter: Payer: Self-pay | Admitting: Obstetrics and Gynecology

## 2023-03-24 ENCOUNTER — Ambulatory Visit
Admission: RE | Admit: 2023-03-24 | Discharge: 2023-03-24 | Disposition: A | Discharge status: Home / Self Care | Source: Ambulatory Visit | Attending: Family Medicine | Admitting: Family Medicine

## 2023-03-24 VITALS — BP 124/87 | HR 100 | Temp 97.8°F | Resp 19 | Ht 60.0 in | Wt 200.0 lb

## 2023-03-24 DIAGNOSIS — U071 COVID-19: Secondary | ICD-10-CM | POA: Diagnosis not present

## 2023-03-24 LAB — POC COVID19/FLU A&B COMBO
Covid Antigen, POC: POSITIVE — AB
Influenza A Antigen, POC: NEGATIVE
Influenza B Antigen, POC: NEGATIVE

## 2023-03-24 NOTE — ED Triage Notes (Signed)
 Pt presents with complaints of dry cough, head congestion, and bilateral ear fullness x couple of weeks. Fever reported on Saturday 3/1. 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 Pt currently denies pain & denies taking OTC medications for symptoms.

## 2023-03-24 NOTE — ED Provider Notes (Signed)
 [name]    CSN: [id number] Arrival date & time: 03/24/23  0845      History   Chief Complaint Chief Complaint  Patient presents with   Cough    I have had a lot of head congestion, a dry cough and my ears feeling full/hurting. I had a fever of almost 101 yesterday. - Entered by patient    HPI [name] is a 53 y.o. female.    Cough Associated symptoms: ear pain (Pressure), fever and rhinorrhea   Associated symptoms: no chills, no rash, no shortness of breath and no sore throat   Has had nasal congestion, rhinorrhea and a dry cough for a week, several days ago developed fatigue, body aches and fever. Denies sore throat, headache, chest pain, shortness of breath, nausea, vomiting, diarrhea, rashes or skin changes.  She is a Education officer, museum, admits exposures at work.  Past Medical History:  Diagnosis Date   ALLERGIC RHINITIS    ANXIETY    GERD    Gestational diabetes    HYPERLIPIDEMIA    Impaired glucose tolerance    Tachycardia     Patient Active Problem List   Diagnosis Date Noted   Well woman exam with routine gynecological exam 12/18/2022   Rash 08/14/2022   Dyspnea 03/11/2021   Vitamin D deficiency 03/13/2020   Right shoulder strain, initial encounter 07/10/2017   Rectal itching 07/08/2017   HTN (hypertension) 07/12/2015   UTI (urinary tract infection) 04/27/2015   Sinus infection 12/27/2014   Encounter for well adult exam with abnormal findings 03/26/2013   Hemorrhoid 03/26/2013   Diabetes (HCC) 12/03/2010   GERD 01/17/2010   PALPITATIONS, RECURRENT 07/01/2007   Hyperlipidemia 04/09/2007   Obesity 09/01/2006   Anxiety state 09/01/2006   Allergic rhinitis 09/01/2006    Past Surgical History:  Procedure Laterality Date   CESAREAN SECTION  2003/2008   x 2   NASAL SINUS SURGERY  1993   TONSILLECTOMY AND ADENOIDECTOMY      OB History     Gravida  2   Para  2   Term  0   Preterm      AB      Living  2       SAB      IAB      Ectopic      Multiple      Live Births               Home Medications    Prior to Admission medications   Medication Sig Start Date End Date Taking? Authorizing Provider  clobetasol (TEMOVATE) 0.05 % external solution Apply topically. 07/17/20   [provider]  IBUPROFEN PO Take by mouth as needed.    [provider]  metoprolol tartrate (LOPRESSOR) 25 MG tablet Take 1 tablet by mouth once daily 09/24/22   Corwin Levins, MD  Multiple Vitamin (MULTIVITAMIN) capsule Take 1 capsule by mouth daily.    [provider]  triamcinolone cream (KENALOG) 0.1 % Apply 1 Application topically 2 (two) times daily. 08/14/22 08/14/23  Corwin Levins, MD    Family History Family History  Problem Relation Age of Onset   Breast cancer Mother 21   Colon polyps Mother    Parkinson's disease Mother    Atrial fibrillation Mother    Colon polyps Father    Diabetes Father    Heart disease Father    Liver disease Father    Colon cancer Neg Hx  Esophageal cancer Neg Hx    Rectal cancer Neg Hx    Stomach cancer Neg Hx     Social History Social History   Tobacco Use   Smoking status: Never   Smokeless tobacco: Never  Vaping Use   Vaping status: Never Used  Substance Use Topics   Alcohol use: Yes    Comment: occasional-2 per month   Drug use: No     Allergies   Ciprofloxacin and Sulfonamide derivatives   Review of Systems Review of Systems  Constitutional:  Positive for fatigue and fever. Negative for appetite change and chills.  HENT:  Positive for ear pain (Pressure) and rhinorrhea. Negative for congestion and sore throat.   Respiratory:  Positive for cough. Negative for shortness of breath.   Gastrointestinal:  Negative for abdominal pain, nausea and vomiting.  Skin:  Negative for rash.     Physical Exam Triage Vital Signs ED Triage Vitals  Encounter Vitals Group     BP 03/24/23 0853 124/87     Systolic BP Percentile --       Diastolic BP Percentile --      Pulse Rate 03/24/23 0853 100     Resp 03/24/23 0853 19     Temp 03/24/23 0853 97.8 F (36.6 C)     Temp Source 03/24/23 0853 Oral     SpO2 03/24/23 0853 95 %     Weight 03/24/23 0901 200 lb (90.7 kg)     Height 03/24/23 0901 5' (1.524 m)     Head Circumference --      Peak Flow --      Pain Score 03/24/23 0901 0     Pain Loc --      Pain Education --      Exclude from Growth Chart --    No data found.  Updated Vital Signs BP 124/87 (BP Location: Right Arm)   Pulse 100   Temp 97.8 F (36.6 C) (Oral)   Resp 19   Ht 5' (1.524 m)   Wt 200 lb (90.7 kg)   SpO2 95%   BMI 39.06 kg/m   Visual Acuity Right Eye Distance:   Left Eye Distance:   Bilateral Distance:    Right Eye Near:   Left Eye Near:    Bilateral Near:     Physical Exam Vitals and nursing note reviewed.  Constitutional:      Appearance: She is not ill-appearing.  HENT:     Head: Normocephalic and atraumatic.     Right Ear: Tympanic membrane and ear canal normal.     Left Ear: Tympanic membrane and ear canal normal.     Nose: Congestion present. No rhinorrhea.     Mouth/Throat:     Mouth: Mucous membranes are moist.     Pharynx: Oropharynx is clear. No oropharyngeal exudate or posterior oropharyngeal erythema.  Eyes:     Conjunctiva/sclera: Conjunctivae normal.  Cardiovascular:     Rate and Rhythm: Normal rate.  Pulmonary:     Effort: Pulmonary effort is normal.     Breath sounds: Normal breath sounds.  Musculoskeletal:     Cervical back: Neck supple.  Lymphadenopathy:     Cervical: No cervical adenopathy.  Skin:    General: Skin is warm and dry.  Neurological:     Mental Status: She is alert and oriented to person, place, and time.      UC Treatments / Results  Labs (all labs ordered are listed, but only abnormal results are displayed)  Labs Reviewed - No data to display  EKG   Radiology No results found.  Procedures Procedures (including critical  care time)  Medications Ordered in UC Medications - No data to display  Initial Impression / Assessment and Plan / UC Course  I have reviewed the triage vital signs and the nursing notes.  Pertinent labs & imaging results that were available during my care of the patient were reviewed by me and considered in my medical decision making (see chart for details).     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  She is well-appearing, afebrile, nontoxic.  Point-of-care COVID is positive, point-of-care flu is negative.  Home care and follow-up reviewed with patient  Final Clinical Impressions(s) / UC Diagnoses   Final diagnoses:  None   Discharge Instructions   None    ED Prescriptions   None    PDMP not reviewed this encounter.   Meliton Rattan, [place] 03/24/23 [phone]

## 2023-03-24 NOTE — Discharge Instructions (Signed)

## 2023-06-17 ENCOUNTER — Other Ambulatory Visit: Payer: Self-pay

## 2023-06-17 ENCOUNTER — Ambulatory Visit
Admission: RE | Admit: 2023-06-17 | Discharge: 2023-06-17 | Disposition: A | Discharge status: Home / Self Care | Source: Ambulatory Visit | Attending: Physician Assistant | Admitting: Physician Assistant

## 2023-06-17 VITALS — BP 115/81 | HR 77 | Temp 97.9°F | Resp 20 | Ht 60.0 in | Wt 200.0 lb

## 2023-06-17 DIAGNOSIS — W57XXXA Bitten or stung by nonvenomous insect and other nonvenomous arthropods, initial encounter: Secondary | ICD-10-CM | POA: Diagnosis not present

## 2023-06-17 DIAGNOSIS — S70362A Insect bite (nonvenomous), left thigh, initial encounter: Secondary | ICD-10-CM

## 2023-06-17 MED ORDER — DOXYCYCLINE HYCLATE 100 MG PO CAPS: 200.0000 mg | ORAL_CAPSULE | Freq: Once | ORAL | 0 refills | Status: AC

## 2023-06-17 NOTE — Discharge Instructions (Addendum)
 You were seen today for concerns of a tick bite of your leg.  At this time I do not see any evidence of a rash and you deny concerns for flulike illness or feeling fatigued or ill.  Your vitals are also reassuring today. Given that your tick bite happened yesterday I recommend starting a prophylactic antibiotic to help prevent tickborne illness.  I have sent in a medication called doxycycline for you to take once to help prevent tickborne illness.  Please take doxycycline 200 mg by mouth once with food and plenty of water.  This can be rough on the stomach so please make sure that you take it in the middle of the meal with plenty of water.  Doxycycline can also cause increased sensitivity to sunlight so please make sure that you are covering your skin with long layers as well as sunscreen to help prevent this at least for the next 1 to 2 weeks.  If at any point you start to develop a rash around the bite area, rash on the palms of your hands or the soles of your feet, fever, chills, flulike symptoms, fatigue please return here or follow-up with your primary care provider as these could be signs of tickborne illness.

## 2023-06-17 NOTE — ED Provider Notes (Signed)
 [name]    CSN: [id number] Arrival date & time: 06/17/23  1043      History   Chief Complaint Chief Complaint  Patient presents with   Insect Bite    I had a tick on me last night that my daughter had to get for me. The spot hurt and burned for the better part of last night so I would like to get it checked out. - Entered by patient    HPI [name] is a 53 y.o. female.   HPI Pt reports concerns for tick bite last night She states she had some burning and pain throughout the night after her daughter removed it and applied alcohol to the area She states the area is posterior upper left thigh  She states the tick looked engorged to her  after removal    Past Medical History:  Diagnosis Date   ALLERGIC RHINITIS    ANXIETY    GERD    Gestational diabetes    HYPERLIPIDEMIA    Impaired glucose tolerance    Tachycardia     Patient Active Problem List   Diagnosis Date Noted   Well woman exam with routine gynecological exam 12/18/2022   Rash 08/14/2022   Dyspnea 03/11/2021   Vitamin D  deficiency 03/13/2020   Right shoulder strain, initial encounter 07/10/2017   Rectal itching 07/08/2017   HTN (hypertension) 07/12/2015   UTI (urinary tract infection) 04/27/2015   Sinus infection 12/27/2014   Encounter for well adult exam with abnormal findings 03/26/2013   Hemorrhoid 03/26/2013   Diabetes (HCC) 12/03/2010   GERD 01/17/2010   PALPITATIONS, RECURRENT 07/01/2007   Hyperlipidemia 04/09/2007   Obesity 09/01/2006   Anxiety state 09/01/2006   Allergic rhinitis 09/01/2006    Past Surgical History:  Procedure Laterality Date   CESAREAN SECTION  2003/2008   x 2   NASAL SINUS SURGERY  1993   TONSILLECTOMY AND ADENOIDECTOMY      OB History     Gravida  2   Para  2   Term  0   Preterm      AB      Living  2      SAB      IAB      Ectopic      Multiple      Live Births               Home Medications    Prior to  Admission medications   Medication Sig Start Date End Date Taking? Authorizing Provider  doxycycline (VIBRAMYCIN) 100 MG capsule Take 2 capsules (200 mg total) by mouth once for 1 dose. 06/17/23 06/17/23 Yes Daana Petrasek E, PA-C  clobetasol  (TEMOVATE ) 0.05 % external solution Apply topically. 07/17/20   [provider]  IBUPROFEN PO Take by mouth as needed.    [provider]  metoprolol  tartrate (LOPRESSOR ) 25 MG tablet Take 1 tablet by mouth once daily 09/24/22   Roslyn Coombe, MD  Multiple Vitamin (MULTIVITAMIN) capsule Take 1 capsule by mouth daily.    [provider]  triamcinolone  cream (KENALOG ) 0.1 % Apply 1 Application topically 2 (two) times daily. 08/14/22 08/14/23  Roslyn Coombe, MD    Family History Family History  Problem Relation Age of Onset   Breast cancer Mother 52   Colon polyps Mother    Parkinson's disease Mother    Atrial fibrillation Mother    Colon polyps Father    Diabetes Father  Heart disease Father    Liver disease Father    Colon cancer Neg Hx    Esophageal cancer Neg Hx    Rectal cancer Neg Hx    Stomach cancer Neg Hx     Social History Social History   Tobacco Use   Smoking status: Never   Smokeless tobacco: Never  Vaping Use   Vaping status: Never Used  Substance Use Topics   Alcohol use: Yes    Comment: occasional-2 per month   Drug use: No     Allergies   Ciprofloxacin and Sulfonamide derivatives   Review of Systems Review of Systems  Skin:        Tick bite      Physical Exam Triage Vital Signs ED Triage Vitals  Encounter Vitals Group     BP 06/17/23 1118 115/81     Systolic BP Percentile --      Diastolic BP Percentile --      Pulse Rate 06/17/23 1118 77     Resp 06/17/23 1118 20     Temp 06/17/23 1118 97.9 F (36.6 C)     Temp Source 06/17/23 1118 Oral     SpO2 06/17/23 1118 94 %     Weight 06/17/23 1121 199 lb 15.3 oz (90.7 kg)     Height 06/17/23 1121 5' (1.524 m)     Head Circumference --       Peak Flow --      Pain Score 06/17/23 1120 2     Pain Loc --      Pain Education --      Exclude from Growth Chart --    No data found.  Updated Vital Signs BP 115/81 (BP Location: Right Arm)   Pulse 77   Temp 97.9 F (36.6 C) (Oral)   Resp 20   Ht 5' (1.524 m)   Wt 199 lb 15.3 oz (90.7 kg)   SpO2 94%   BMI 39.05 kg/m   Visual Acuity Right Eye Distance:   Left Eye Distance:   Bilateral Distance:    Right Eye Near:   Left Eye Near:    Bilateral Near:     Physical Exam Vitals reviewed.  Constitutional:      General: She is awake. She is not in acute distress.    Appearance: Normal appearance. She is well-developed and well-groomed. She is not ill-appearing or toxic-appearing.  HENT:     Head: Normocephalic and atraumatic.  Eyes:     Extraocular Movements: Extraocular movements intact.     Conjunctiva/sclera: Conjunctivae normal.  Pulmonary:     Effort: Pulmonary effort is normal.  Musculoskeletal:     Cervical back: Normal range of motion.  Skin:    General: Skin is warm and dry.     Findings: No abrasion, bruising, ecchymosis, erythema, lesion, petechiae, rash or wound.          Comments: No signs of erythema, rash, swelling, bruising, bleeding or drainage and area indicated  Neurological:     General: No focal deficit present.     Mental Status: She is alert and oriented to person, place, and time.  Psychiatric:        Mood and Affect: Mood normal.        Behavior: Behavior normal. Behavior is cooperative.        Thought Content: Thought content normal.        Judgment: Judgment normal.      UC Treatments / Results  Labs (all  labs ordered are listed, but only abnormal results are displayed) Labs Reviewed - No data to display  EKG   Radiology No results found.  Procedures Procedures (including critical care time)  Medications Ordered in UC Medications - No data to display  Initial Impression / Assessment and Plan / UC Course  I have  reviewed the triage vital signs and the nursing notes.  Pertinent labs & imaging results that were available during my care of the patient were reviewed by me and considered in my medical decision making (see chart for details).      Final Clinical Impressions(s) / UC Diagnoses   Final diagnoses:  Tick bite of left thigh, initial encounter   Patient presents today with concerns of a tick bite to the posterior left thigh area.  She reports that the tick was removed last night but she did have persistent burning to the area after removal.  Physical exam is overall reassuring at this time.  No signs of rash, embedded foreign material or tick head in area indicated.  Patient denies having flulike symptoms, fatigue, fever or chills.  Given the fact that patient's bite occurred sometime yesterday we will start doxycycline 200 mg p.o. once for prophylactic management.  Recommend Tylenol and ibuprofen as needed for pain management.  ED and return precautions reviewed and provided in after visit summary.  Follow-up as needed.    Discharge Instructions      You were seen today for concerns of a tick bite of your leg.  At this time I do not see any evidence of a rash and you deny concerns for flulike illness or feeling fatigued or ill.  Your vitals are also reassuring today. Given that your tick bite happened yesterday I recommend starting a prophylactic antibiotic to help prevent tickborne illness.  I have sent in a medication called doxycycline for you to take once to help prevent tickborne illness.  Please take doxycycline 200 mg by mouth once with food and plenty of water.  This can be rough on the stomach so please make sure that you take it in the middle of the meal with plenty of water.  Doxycycline can also cause increased sensitivity to sunlight so please make sure that you are covering your skin with long layers as well as sunscreen to help prevent this at least for the next 1 to 2 weeks.  If at  any point you start to develop a rash around the bite area, rash on the palms of your hands or the soles of your feet, fever, chills, flulike symptoms, fatigue please return here or follow-up with your primary care provider as these could be signs of tickborne illness.   ED Prescriptions     Medication Sig Dispense Auth. Provider   doxycycline (VIBRAMYCIN) 100 MG capsule Take 2 capsules (200 mg total) by mouth once for 1 dose. 2 capsule Domonick Sittner E, PA-C      PDMP not reviewed this encounter.   Jerona Mooring, PA-C 06/17/23 1242

## 2023-06-17 NOTE — ED Triage Notes (Signed)
 Pt presents with complaints of tick removal last night by daughter, located underneath left buttock area. Pt currently rates her overall pain a 2/10. States it is difficult to visualize the site. Daughter removed tick with tweezers. Washed area with alcohol after. Pain and burning at site during the night, was difficult to sleep.

## 2023-07-02 ENCOUNTER — Encounter: Payer: Self-pay | Admitting: Internal Medicine

## 2023-09-21 IMAGING — MG MM DIGITAL SCREENING BILAT W/ TOMO AND CAD
8 series · 8 of 24 positions shown · non-contrast
Comparison: Previous exam(s).

CLINICAL DATA: Screening.

EXAM:
DIGITAL SCREENING BILATERAL MAMMOGRAM WITH TOMOSYNTHESIS AND CAD
TECHNIQUE: Bilateral screening digital craniocaudal and mediolateral oblique
mammograms were obtained. Bilateral screening digital breast
tomosynthesis was performed. The images were evaluated with
computer-aided detection.

[R CC synth-2D]
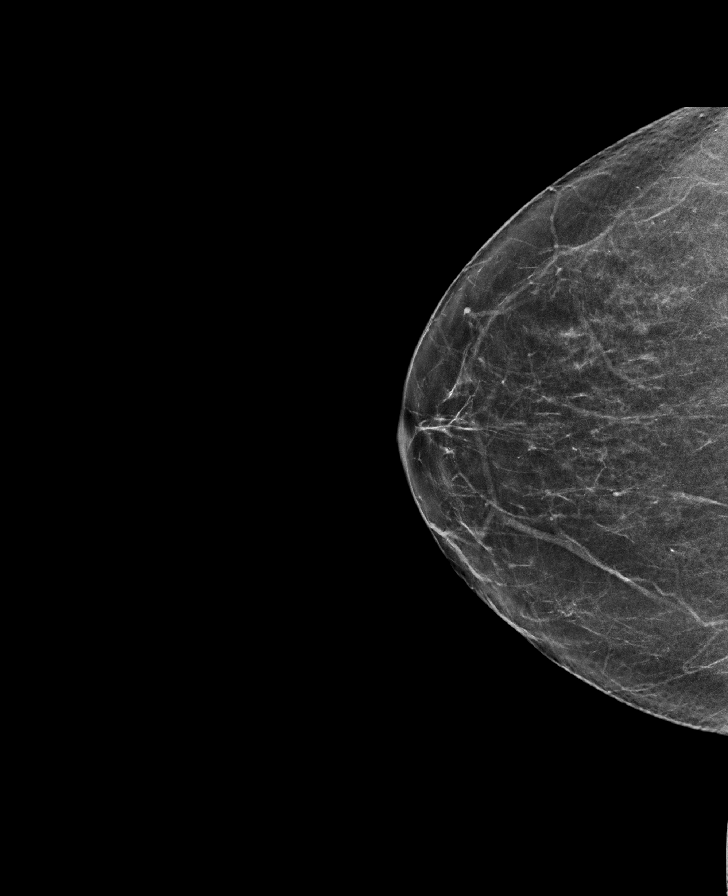

[R MLO synth-2D]
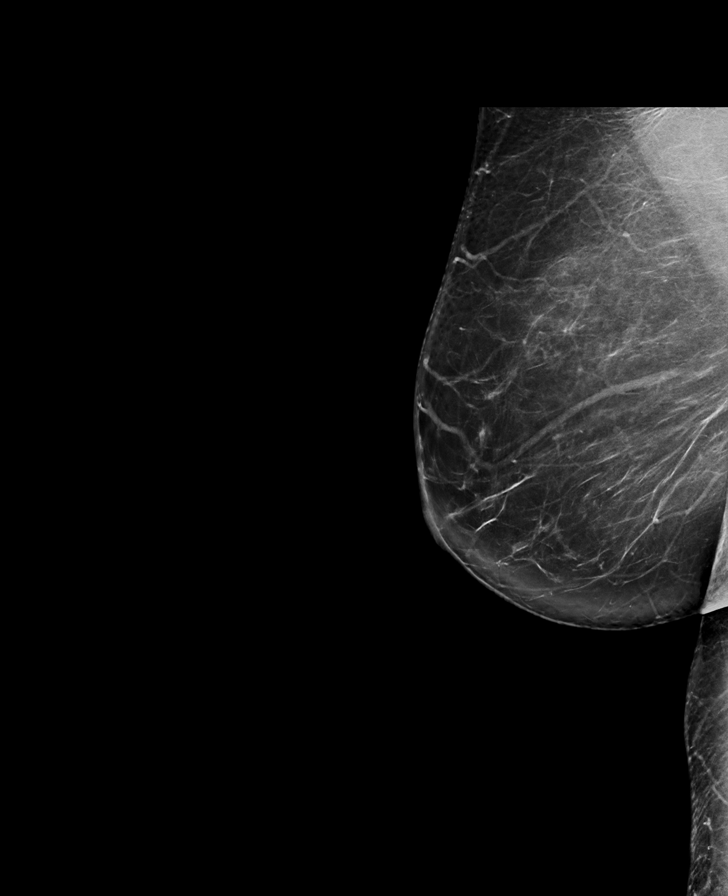

[L CC synth-2D]
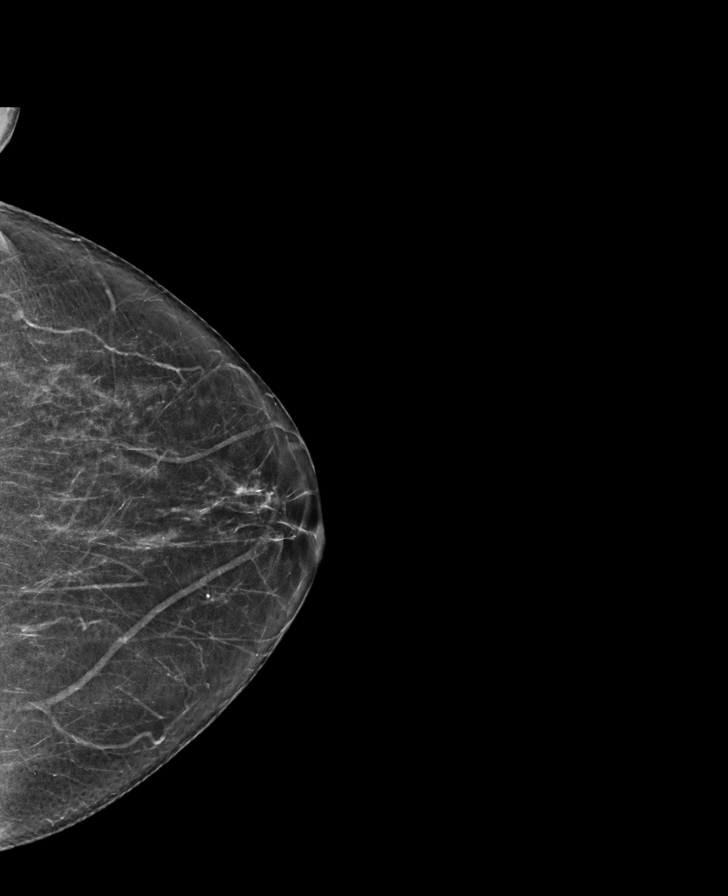

[L MLO synth-2D]
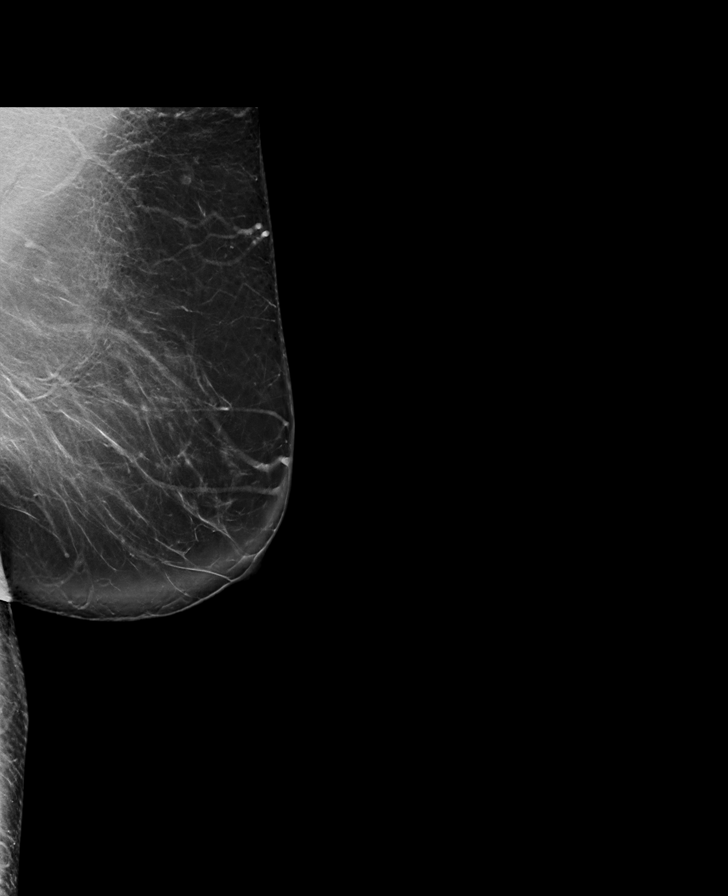

[L MLO tomo · tomo slice 45/90.0]
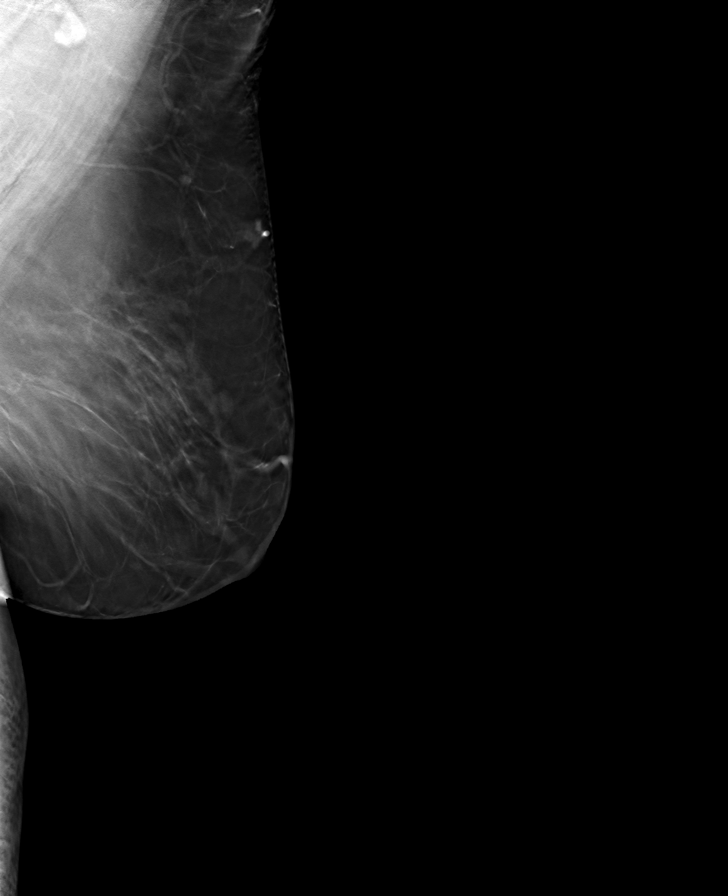

[R CC tomo · tomo slice 33/66.0]
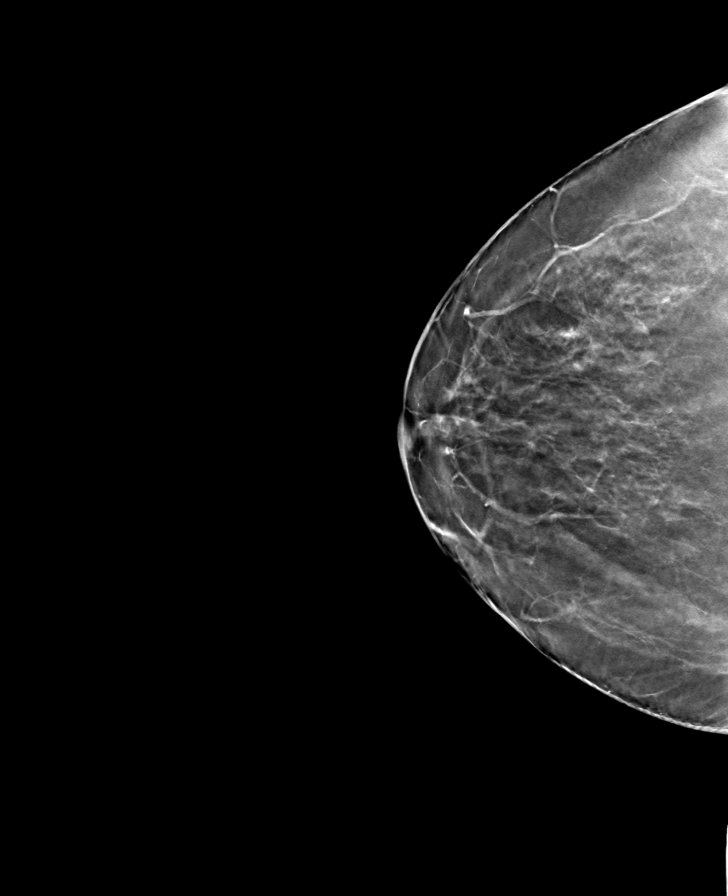

[R MLO tomo · tomo slice 43/84.0]
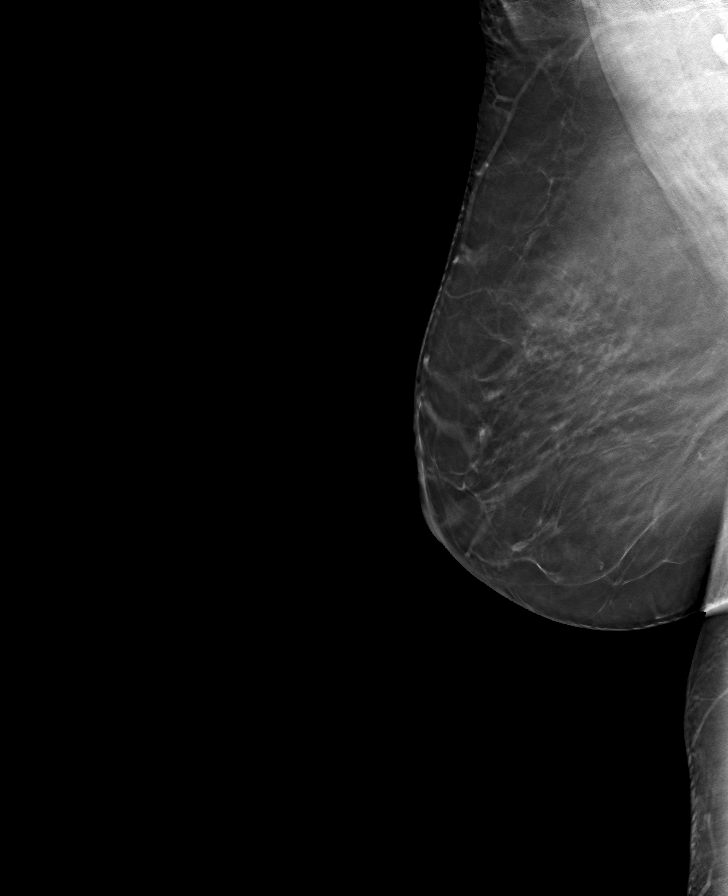

[L CC tomo · tomo slice 33/65.0]
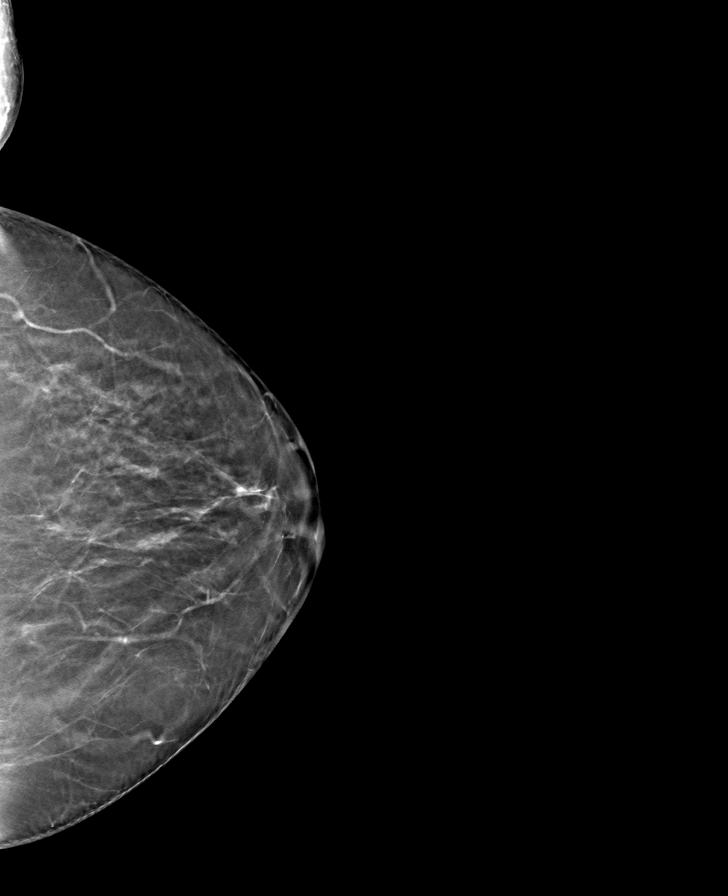

[8 of 24 positions shown; findings below may reference images not displayed]

ACR Breast Density Category b: There are scattered areas of
fibroglandular density.
FINDINGS: There are no findings suspicious for malignancy.
IMPRESSION: No mammographic evidence of malignancy. A result letter of this
screening mammogram will be mailed directly to the patient.

RECOMMENDATION:
Screening mammogram in one year. (Code:51-O-LD2)

BI-RADS CATEGORY  1: Negative.

## 2023-10-24 ENCOUNTER — Telehealth: Payer: Self-pay | Admitting: Internal Medicine

## 2023-10-24 ENCOUNTER — Other Ambulatory Visit: Payer: Self-pay | Admitting: Internal Medicine

## 2023-10-24 MED ORDER — METOPROLOL TARTRATE 25 MG PO TABS: ORAL_TABLET | ORAL | 3 refills | Status: DC

## 2023-10-24 NOTE — Telephone Encounter (Signed)
 Copied from CRM [id number]. Topic: Clinical - Medication Refill >> Oct 24, 2023  8:16 AM Amy B wrote: Medication: metoprolol  tartrate (LOPRESSOR ) 25 MG tablet  Has the patient contacted their pharmacy? No (Agent: If no, request that the patient contact the pharmacy for the refill. If patient does not wish to contact the pharmacy document the reason why and proceed with request.) (Agent: If yes, when and what did the pharmacy advise?)  This is the patient's preferred pharmacy:  [id number] - [place], [place] - [address] AT [facility] [facility] [address] [facility] [address] [place] [address] Phone: [phone] Fax: [phone]  Is this the correct pharmacy for this prescription? Yes If no, delete pharmacy and type the correct one.   Has the prescription been filled recently? No  Is the patient out of the medication? No  Has the patient been seen for an appointment in the last year OR does the patient have an upcoming appointment? Yes  Can we respond through MyChart? Yes  Agent: Please be advised that Rx refills may take up to 3 business days. We ask that you follow-up with your pharmacy.

## 2023-11-27 ENCOUNTER — Encounter: Payer: Self-pay | Admitting: Internal Medicine

## 2023-11-27 ENCOUNTER — Ambulatory Visit: Payer: Self-pay | Admitting: Internal Medicine

## 2023-11-27 ENCOUNTER — Other Ambulatory Visit: Payer: Self-pay | Admitting: Internal Medicine

## 2023-11-27 ENCOUNTER — Ambulatory Visit: Admitting: Internal Medicine

## 2023-11-27 VITALS — BP 124/78 | HR 87 | Temp 98.3°F | Ht 60.0 in | Wt 215.0 lb

## 2023-11-27 DIAGNOSIS — E538 Deficiency of other specified B group vitamins: Secondary | ICD-10-CM | POA: Diagnosis not present

## 2023-11-27 DIAGNOSIS — Z23 Encounter for immunization: Secondary | ICD-10-CM

## 2023-11-27 DIAGNOSIS — Z0000 Encounter for general adult medical examination without abnormal findings: Secondary | ICD-10-CM | POA: Diagnosis not present

## 2023-11-27 DIAGNOSIS — E559 Vitamin D deficiency, unspecified: Secondary | ICD-10-CM

## 2023-11-27 DIAGNOSIS — E785 Hyperlipidemia, unspecified: Secondary | ICD-10-CM | POA: Diagnosis not present

## 2023-11-27 DIAGNOSIS — E1165 Type 2 diabetes mellitus with hyperglycemia: Secondary | ICD-10-CM

## 2023-11-27 DIAGNOSIS — R058 Other specified cough: Secondary | ICD-10-CM | POA: Diagnosis not present

## 2023-11-27 DIAGNOSIS — Z0001 Encounter for general adult medical examination with abnormal findings: Secondary | ICD-10-CM

## 2023-11-27 DIAGNOSIS — I10 Essential (primary) hypertension: Secondary | ICD-10-CM

## 2023-11-27 LAB — URINALYSIS, ROUTINE W REFLEX MICROSCOPIC
Bilirubin Urine: NEGATIVE
Hgb urine dipstick: NEGATIVE
Ketones, ur: NEGATIVE
Nitrite: NEGATIVE
RBC / HPF: NONE SEEN (ref 0–?)
Specific Gravity, Urine: 1.015 (ref 1.000–1.030)
Total Protein, Urine: NEGATIVE
Urine Glucose: NEGATIVE
Urobilinogen, UA: 0.2 (ref 0.0–1.0)
pH: 7 (ref 5.0–8.0)

## 2023-11-27 LAB — VITAMIN D 25 HYDROXY (VIT D DEFICIENCY, FRACTURES): VITD: 32.47 ng/mL (ref 30.00–100.00)

## 2023-11-27 LAB — CBC WITH DIFFERENTIAL/PLATELET
Basophils Absolute: 0.1 K/uL (ref 0.0–0.1)
Basophils Relative: 0.9 % (ref 0.0–3.0)
Eosinophils Absolute: 0.7 K/uL (ref 0.0–0.7)
Eosinophils Relative: 6.6 % — ABNORMAL HIGH (ref 0.0–5.0)
HCT: 39.5 % (ref 36.0–46.0)
Hemoglobin: 13.3 g/dL (ref 12.0–15.0)
Lymphocytes Relative: 24.8 % (ref 12.0–46.0)
Lymphs Abs: 2.7 K/uL (ref 0.7–4.0)
MCHC: 33.6 g/dL (ref 30.0–36.0)
MCV: 88 fl (ref 78.0–100.0)
Monocytes Absolute: 0.7 K/uL (ref 0.1–1.0)
Monocytes Relative: 6.6 % (ref 3.0–12.0)
Neutro Abs: 6.5 K/uL (ref 1.4–7.7)
Neutrophils Relative %: 61.1 % (ref 43.0–77.0)
Platelets: 321 K/uL (ref 150.0–400.0)
RBC: 4.49 Mil/uL (ref 3.87–5.11)
RDW: 13 % (ref 11.5–15.5)
WBC: 10.7 K/uL — ABNORMAL HIGH (ref 4.0–10.5)

## 2023-11-27 LAB — HEPATIC FUNCTION PANEL
ALT: 32 U/L (ref 0–35)
AST: 25 U/L (ref 0–37)
Albumin: 4.1 g/dL (ref 3.5–5.2)
Alkaline Phosphatase: 63 U/L (ref 39–117)
Bilirubin, Direct: 0 mg/dL (ref 0.0–0.3)
Total Bilirubin: 0.3 mg/dL (ref 0.2–1.2)
Total Protein: 7.1 g/dL (ref 6.0–8.3)

## 2023-11-27 LAB — MICROALBUMIN / CREATININE URINE RATIO
Creatinine,U: 70.2 mg/dL
Microalb Creat Ratio: UNDETERMINED mg/g (ref 0.0–30.0)
Microalb, Ur: <0.7 mg/dL

## 2023-11-27 LAB — BASIC METABOLIC PANEL WITH GFR
BUN: 14 mg/dL (ref 6–23)
CO2: 31 meq/L (ref 19–32)
Calcium: 9.6 mg/dL (ref 8.4–10.5)
Chloride: 100 meq/L (ref 96–112)
Creatinine, Ser: 0.61 mg/dL (ref 0.40–1.20)
GFR: 102.15 mL/min (ref >60.00)
Glucose, Bld: 138 mg/dL — ABNORMAL HIGH (ref 70–99)
Potassium: 3.6 meq/L (ref 3.5–5.1)
Sodium: 140 meq/L (ref 135–145)

## 2023-11-27 LAB — HEMOGLOBIN A1C: Hgb A1c MFr Bld: 7.3 % — ABNORMAL HIGH (ref 4.6–6.5)

## 2023-11-27 LAB — LIPID PANEL
Cholesterol: 180 mg/dL (ref 0–200)
HDL: 54.5 mg/dL (ref >39.00)
LDL Cholesterol: 109 mg/dL — ABNORMAL HIGH (ref 0–99)
NonHDL: 125.31
Total CHOL/HDL Ratio: 3
Triglycerides: 81 mg/dL (ref 0.0–149.0)
VLDL: 16.2 mg/dL (ref 0.0–40.0)

## 2023-11-27 LAB — VITAMIN B12: Vitamin B-12: 972 pg/mL — ABNORMAL HIGH (ref 211–911)

## 2023-11-27 LAB — TSH: TSH: 2.34 u[IU]/mL (ref 0.35–5.50)

## 2023-11-27 MED ORDER — METOPROLOL TARTRATE 25 MG PO TABS: ORAL_TABLET | ORAL | 3 refills | Status: AC

## 2023-11-27 MED ORDER — ROSUVASTATIN CALCIUM 10 MG PO TABS: 10.0000 mg | ORAL_TABLET | Freq: Every day | ORAL | 3 refills | Status: AC

## 2023-11-27 MED ORDER — METFORMIN HCL ER 500 MG PO TB24: 500.0000 mg | ORAL_TABLET | Freq: Every day | ORAL | 3 refills | Status: AC

## 2023-11-27 MED ORDER — AZITHROMYCIN 250 MG PO TABS: ORAL_TABLET | ORAL | 1 refills | Status: AC

## 2023-11-27 NOTE — Progress Notes (Signed)
 Patient ID: [name], female   DOB: Nov 17, 1970, 53 y.o.   MRN: [id number]         Chief Complaint:: wellness exam and dm, low vit d, prod cough       HPI:  [name] is a 53 y.o. female here for wellness exam; plans to call for exam exam does not need referral, for flu shot today, declines hep B and prevnar for now, for shingrx at the pharmacyk o/w up to date                Also Here with acute onset mild to mod 2-3 days ST, HA, general weakness and malaise, with prod cough greenish sputum, but Pt denies chest pain, increased sob or doe, wheezing, orthopnea, PND, increased LE swelling, palpitations, dizziness or syncope.   Pt denies polydipsia, polyuria, or new focal neuro s/s.    Pt denies wt loss, night sweats, loss of appetite, or other constitutional symptoms  Due for flu shot   Wt Readings from Last 3 Encounters:  11/27/23 215 lb (97.5 kg)  06/17/23 199 lb 15.3 oz (90.7 kg)  03/24/23 200 lb (90.7 kg)   BP Readings from Last 3 Encounters:  11/27/23 124/78  06/17/23 115/81  03/24/23 124/87   Immunization History  Administered Date(s) Administered   Influenza Split 10/16/2011   Influenza Whole 11/05/2007   Influenza, Seasonal, Injecte, Preservative Fre 10/02/2012, 12/18/2022, 11/27/2023   Influenza,inj,Quad PF,6+ Mos 09/29/2012, 10/31/2016, 10/15/2017, 10/22/2018, 11/15/2019, 11/28/2020, 12/12/2021   Influenza-Unspecified 11/30/2013   PFIZER(Purple Top)SARS-COV-2 Vaccination 03/22/2019, 04/19/2019, 01/10/2020   Td 06/21/2008   Tdap 03/13/2020   Health Maintenance Due  Topic Date Due   OPHTHALMOLOGY EXAM  Never done   Pneumococcal Vaccine: 50+ Years (1 of 2 - PCV) Never done   Hepatitis B Vaccines 19-59 Average Risk (1 of 3 - 19+ 3-dose series) Never done   Zoster Vaccines- Shingrix (1 of 2) Never done      Past Medical History:  Diagnosis Date   ALLERGIC RHINITIS    ANXIETY    GERD    Gestational diabetes    HYPERLIPIDEMIA    Impaired glucose tolerance     Tachycardia    Past Surgical History:  Procedure Laterality Date   CESAREAN SECTION  2003/2008   x 2   NASAL SINUS SURGERY  1993   TONSILLECTOMY AND ADENOIDECTOMY      reports that she has never smoked. She has never used smokeless tobacco. She reports current alcohol use. She reports that she does not use drugs. family history includes Atrial fibrillation in her mother; Breast cancer (age of onset: 10) in her mother; Colon polyps in her father and mother; Diabetes in her father; Heart disease in her father; Liver disease in her father; Parkinson's disease in her mother. Allergies  Allergen Reactions   Ciprofloxacin Rash   Sulfonamide Derivatives Rash   Current Outpatient Medications on File Prior to Visit  Medication Sig Dispense Refill   clobetasol  (TEMOVATE ) 0.05 % external solution Apply topically.     IBUPROFEN PO Take by mouth as needed.     Multiple Vitamin (MULTIVITAMIN) capsule Take 1 capsule by mouth daily.     No current facility-administered medications on file prior to visit.        ROS:  All others reviewed and negative.  Objective        PE:  BP 124/78 (BP Location: Right Arm, Patient Position: Sitting, Cuff Size: Normal)   Pulse 87  Temp 98.3 F (36.8 C) (Oral)   Ht 5' (1.524 m)   Wt 215 lb (97.5 kg)   SpO2 97%   BMI 41.99 kg/m                 Constitutional: Pt appears mild ill               HENT: Head: NCAT.                Right Ear: External ear normal.                 Left Ear: External ear normal. Bilat tm's with mild erythema.  Max sinus areas non tender.  Pharynx with mild erythema, no exudate               Eyes: . Pupils are equal, round, and reactive to light. Conjunctivae and EOM are normal               Nose: without d/c or deformity               Neck: Neck supple. Gross normal ROM               Cardiovascular: Normal rate and regular rhythm.                 Pulmonary/Chest: Effort normal and breath sounds without rales or wheezing.                 Abd:  Soft, NT, ND, + BS, no organomegaly               Neurological: Pt is alert. At baseline orientation, motor grossly intact               Skin: Skin is warm. No rashes, no other new lesions, LE edema - none               Psychiatric: Pt behavior is normal without agitation   Micro: none  Cardiac tracings I have personally interpreted today:  none  Pertinent Radiological findings (summarize): none   Lab Results  Component Value Date   WBC 10.7 (H) 11/27/2023   HGB 13.3 11/27/2023   HCT 39.5 11/27/2023   PLT 321.0 11/27/2023   GLUCOSE 138 (H) 11/27/2023   CHOL 180 11/27/2023   TRIG 81.0 11/27/2023   HDL 54.50 11/27/2023   LDLDIRECT 142.5 09/01/2006   LDLCALC 109 (H) 11/27/2023   ALT 32 11/27/2023   AST 25 11/27/2023   NA 140 11/27/2023   K 3.6 11/27/2023   CL 100 11/27/2023   CREATININE 0.61 11/27/2023   BUN 14 11/27/2023   CO2 31 11/27/2023   TSH 2.34 11/27/2023   HGBA1C 7.3 (H) 11/27/2023   MICROALBUR <0.7 11/27/2023   Assessment/Plan:  Ess[facility] Mills is a 53 y.o. White or Caucasian [1] female with  has a past medical history of ALLERGIC RHINITIS, ANXIETY, GERD, Gestational diabetes, HYPERLIPIDEMIA, Impaired glucose tolerance, and Tachycardia.  Vitamin D  deficiency Last vitamin D  Lab Results  Component Value Date   VD25OH 32.47 11/27/2023   Low, to start oral replacement   Hyperlipidemia Lab Results  Component Value Date   LDLCALC 109 (H) 11/27/2023   Mild uncontrolled, pt to start statin crestor  10 mg and lower chol diet   HTN (hypertension) BP Readings from Last 3 Encounters:  11/27/23 124/78  06/17/23 115/81  03/24/23 124/87   Stable, pt to continue medical treatment lopressor  25 mg qd   Diabetes mellitus  with hyperglycemia (HCC) With hypergycemia, non insulin use  Lab Results  Component Value Date   HGBA1C 7.3 (H) 11/27/2023   uncontrolled, pt to start metfomrin ER 500 mg qd   Productive cough Mild to mod, can't r/o  bronchitis vs pna, for antibx course zpack,  to f/u any worsening symptoms or concerns  Followup: Return in about 6 months (around 05/26/2024).  Lynwood Rush, MD 11/30/2023 11:55 AM [facility]

## 2023-11-27 NOTE — Patient Instructions (Signed)
 You had the flu shot today  Please take all new medication as prescribed   - the antibiotic  Please continue all other medications as before, and refills have been done if requested.  Please have the pharmacy call with any other refills you may need.  Please continue your efforts at being more active, low cholesterol diet, and weight control.  Please keep your appointments with your specialists as you may have planned  Please go to the LAB at the blood drawing area for the tests to be done  You will be contacted by phone if any changes need to be made immediately.  Otherwise, you will receive a letter about your results with an explanation, but please check with MyChart first.  Please make an Appointment to return in 6 months, or sooner if needed

## 2023-11-30 ENCOUNTER — Encounter: Payer: Self-pay | Admitting: Internal Medicine

## 2023-11-30 DIAGNOSIS — R058 Other specified cough: Secondary | ICD-10-CM | POA: Insufficient documentation

## 2023-11-30 NOTE — Assessment & Plan Note (Signed)
 Last vitamin D  Lab Results  Component Value Date   VD25OH 32.47 11/27/2023   Low, to start oral replacement

## 2023-11-30 NOTE — Assessment & Plan Note (Addendum)
 Lab Results  Component Value Date   LDLCALC 109 (H) 11/27/2023   Mild uncontrolled, pt to start statin crestor  10 mg and lower chol diet

## 2023-11-30 NOTE — Assessment & Plan Note (Signed)
 With hypergycemia, non insulin use  Lab Results  Component Value Date   HGBA1C 7.3 (H) 11/27/2023   uncontrolled, pt to start metfomrin ER 500 mg qd

## 2023-11-30 NOTE — Assessment & Plan Note (Signed)
 Mild to mod, can't r/o bronchitis vs pna, for antibx course zpack,  to f/u any worsening symptoms or concerns

## 2023-11-30 NOTE — Assessment & Plan Note (Signed)
 BP Readings from Last 3 Encounters:  11/27/23 124/78  06/17/23 115/81  03/24/23 124/87   Stable, pt to continue medical treatment lopressor  25 mg qd

## 2024-01-20 ENCOUNTER — Ambulatory Visit (INDEPENDENT_AMBULATORY_CARE_PROVIDER_SITE_OTHER): Admitting: Obstetrics and Gynecology

## 2024-01-20 ENCOUNTER — Encounter: Payer: Self-pay | Admitting: Obstetrics and Gynecology

## 2024-01-20 VITALS — BP 120/72 | HR 88 | Temp 97.7°F | Ht 60.75 in | Wt 210.0 lb

## 2024-01-20 DIAGNOSIS — Z01419 Encounter for gynecological examination (general) (routine) without abnormal findings: Secondary | ICD-10-CM

## 2024-01-20 DIAGNOSIS — Z1331 Encounter for screening for depression: Secondary | ICD-10-CM | POA: Diagnosis not present

## 2024-01-20 NOTE — Assessment & Plan Note (Signed)
 Cervical cancer screening performed according to ASCCP guidelines. Encouraged annual mammogram screening Colonoscopy 2020, q7y DXA N/A Labs and immunizations with her primary Encouraged safe sexual practices as indicated Encouraged healthy lifestyle practices with diet and exercise For patients under 50-53yo, I recommend 1200mg  calcium daily and 600IU of vitamin D daily.

## 2024-01-20 NOTE — Progress Notes (Signed)
 "  53 y.o. [id number] postmenopausal female here for annual exam. 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 PCP: Norleen Lynwood ORN, MD  Last menstrual period October 2023. No bleeding at all this year.  Noticing more urge incontinence. Rarely does kegels.  Pelvic discharge or pain: none Breast mass, nipple discharge or skin changes : none  Last PAP:     Component Value Date/Time   DIAGPAP  12/18/2022 0833    - Negative for Intraepithelial Lesions or Malignancy (NILM)   DIAGPAP - Benign reactive/reparative changes 12/18/2022 0833   DIAGPAP - Endometrial cells present 12/12/2021 1356   DIAGPAP  12/12/2021 1356    - Negative for Intraepithelial Lesions or Malignancy (NILM)   ADEQPAP  12/18/2022 0833    Satisfactory for evaluation; transformation zone component PRESENT.   ADEQPAP  12/12/2021 1356    Satisfactory for evaluation; transformation zone component PRESENT.   Last mammogram: 03/17/23 [facility] 1. Density B  Last colonoscopy: 01/01/2019, benign polyp, q6yr  Sexually active: no  Exercising: yes, walking Smoker: no  Garment/textile Technologist Visit from 01/20/2024 in [facility]  PHQ-2 Total Score 0    GYN HISTORY: No significant history  OB History  Gravida Para Term Preterm AB Living  2 2 0   2  SAB IAB Ectopic Multiple Live Births          # Outcome Date GA Lbr Len/2nd Weight Sex Type Anes PTL Lv  2 Para     F CS-Unspec     1 Para     M CS-Unspec       Past Medical History:  Diagnosis Date   ALLERGIC RHINITIS    ANXIETY    GERD    Gestational diabetes    HYPERLIPIDEMIA    Impaired glucose tolerance    Tachycardia     Past Surgical History:  Procedure Laterality Date   CESAREAN SECTION  2003/2008   x 2   NASAL SINUS SURGERY  1993   TONSILLECTOMY AND ADENOIDECTOMY      Current Outpatient Medications on File Prior to Visit  Medication Sig Dispense Refill   clobetasol  (TEMOVATE ) 0.05 % external solution Apply topically.     IBUPROFEN  PO Take by mouth as needed.     metFORMIN  (GLUCOPHAGE -XR) 500 MG 24 hr tablet Take 1 tablet (500 mg total) by mouth daily with breakfast. 90 tablet 3   metoprolol  tartrate (LOPRESSOR ) 25 MG tablet Take 1 tablet by mouth once daily 90 tablet 3   Multiple Vitamin (MULTIVITAMIN) capsule Take 1 capsule by mouth daily.     rosuvastatin  (CRESTOR ) 10 MG tablet Take 1 tablet (10 mg total) by mouth daily. 90 tablet 3   triamcinolone  cream (KENALOG ) 0.1 % Apply 1 Application topically 2 (two) times daily.     No current facility-administered medications on file prior to visit.    Social History   Socioeconomic History   Marital status: Divorced    Spouse name: Not on file   Number of children: 2   Years of education: Not on file   Highest education level: Not on file  Occupational History   Occupation: TEACHER    Employer: [employer]  Tobacco Use   Smoking status: Never   Smokeless tobacco: Never  Vaping Use   Vaping status: Never Used  Substance and Sexual Activity   Alcohol use: Yes    Comment: occasional-2 per month   Drug use: No   Sexual activity: Not Currently  Partners: Male    Birth control/protection: Abstinence    Comment: 1ST INTERCOURSE- 26, PARTNERS- 3,   Other Topics Concern   Not on file  Social History Narrative   Not on file   Social Drivers of Health   Tobacco Use: Low Risk (01/20/2024)   Patient History    Smoking Tobacco Use: Never    Smokeless Tobacco Use: Never    Passive Exposure: Not on file  Financial Resource Strain: Not on file  Food Insecurity: Not on file  Transportation Needs: Not on file  Physical Activity: Not on file  Stress: Not on file  Social Connections: Not on file  Intimate Partner Violence: Not on file  Depression (PHQ2-9): Low Risk (01/20/2024)   Depression (PHQ2-9)    PHQ-2 Score: 0  Alcohol Screen: Not on file  Housing: Not on file  Utilities: Not on file  Health Literacy: Not on file    Family History  Problem  Relation Age of Onset   Breast cancer Mother 43   Colon polyps Mother    Parkinson's disease Mother    Atrial fibrillation Mother    Colon polyps Father    Diabetes Father    Heart disease Father    Liver disease Father    Colon cancer Neg Hx    Esophageal cancer Neg Hx    Rectal cancer Neg Hx    Stomach cancer Neg Hx     Allergies  Allergen Reactions   Ciprofloxacin Rash   Sulfonamide Derivatives Rash      PE Today's Vitals   01/20/24 0814  BP: 120/72  Pulse: 88  Temp: 97.7 F (36.5 C)  TempSrc: Oral  SpO2: 96%  Weight: 210 lb (95.3 kg)  Height: 5' 0.75 (1.543 m)   Body mass index is 40.01 kg/m.  Physical Exam Vitals reviewed. Exam conducted with a chaperone present.  Constitutional:      General: She is not in acute distress.    Appearance: Normal appearance.  HENT:     Head: Normocephalic and atraumatic.     Nose: Nose normal.  Eyes:     Extraocular Movements: Extraocular movements intact.     Conjunctiva/sclera: Conjunctivae normal.  Pulmonary:     Effort: Pulmonary effort is normal.  Chest:     Chest wall: No mass or tenderness.  Breasts:    Right: Normal. No swelling, mass, nipple discharge, skin change or tenderness.     Left: Normal. No swelling, mass, nipple discharge, skin change or tenderness.  Abdominal:     General: There is no distension.     Palpations: Abdomen is soft.     Tenderness: There is no abdominal tenderness.  Genitourinary:    General: Normal vulva.     Exam position: Lithotomy position.     Urethra: No prolapse.     Vagina: Normal. No vaginal discharge or bleeding.     Cervix: Normal. No lesion.     Uterus: Normal. Not enlarged and not tender.      Adnexa: Right adnexa normal and left adnexa normal.     Comments: Narrow pubic arch Uncoordinated kegel Musculoskeletal:        General: Normal range of motion.  Lymphadenopathy:     Upper Body:     Right upper body: No axillary adenopathy.     Left upper body: No  axillary adenopathy.     Lower Body: No right inguinal adenopathy. No left inguinal adenopathy.  Skin:    General: Skin is warm and  dry.  Neurological:     General: No focal deficit present.     Mental Status: She is alert.  Psychiatric:        Mood and Affect: Mood normal.        Behavior: Behavior normal.      Assessment and Plan:        Well woman exam with routine gynecological exam Assessment & Plan: Cervical cancer screening performed according to ASCCP guidelines. Encouraged annual mammogram screening Colonoscopy 2020, q7y DXA N/A Labs and immunizations with her primary Encouraged safe sexual practices as indicated Encouraged healthy lifestyle practices with diet and exercise For patients under 50-70yo, I recommend 1200mg  calcium  daily and 600IU of vitamin D  daily.    Negative depression screening   Vera LULLA Pa, MD  "

## 2024-01-20 NOTE — Patient Instructions (Signed)
 For patients under 50-53yo, I recommend 1200mg  calcium  daily and 600IU of vitamin D daily. For patients over 53yo, I recommend 1200mg  calcium  daily and 800IU of vitamin D daily.  Health Maintenance, Female Adopting a healthy lifestyle and getting preventive care are important in promoting health and wellness. Ask your health care provider about: The right schedule for you to have regular tests and exams. Things you can do on your own to prevent diseases and keep yourself healthy. What should I know about diet, weight, and exercise? Eat a healthy diet  Eat a diet that includes plenty of vegetables, fruits, low-fat dairy products, and lean protein. Do not eat a lot of foods that are high in solid fats, added sugars, or sodium. Maintain a healthy weight Body mass index (BMI) is used to identify weight problems. It estimates body fat based on height and weight. Your health care provider can help determine your BMI and help you achieve or maintain a healthy weight. Get regular exercise Get regular exercise. This is one of the most important things you can do for your health. Most adults should: Exercise for at least 150 minutes each week. The exercise should increase your heart rate and make you sweat (moderate-intensity exercise). Do strengthening exercises at least twice a week. This is in addition to the moderate-intensity exercise. Spend less time sitting. Even light physical activity can be beneficial. Watch cholesterol and blood lipids Have your blood tested for lipids and cholesterol at 53 years of age, then have this test every 5 years. Have your cholesterol levels checked more often if: Your lipid or cholesterol levels are high. You are older than 53 years of age. You are at high risk for heart disease. What should I know about cancer screening? Depending on your health history and family history, you may need to have cancer screening at various ages. This may include screening  for: Breast cancer. Cervical cancer. Colorectal cancer. Skin cancer. Lung cancer. What should I know about heart disease, diabetes, and high blood pressure? Blood pressure and heart disease High blood pressure causes heart disease and increases the risk of stroke. This is more likely to develop in people who have high blood pressure readings or are overweight. Have your blood pressure checked: Every 3-5 years if you are 25-57 years of age. Every year if you are 24 years old or older. Diabetes Have regular diabetes screenings. This checks your fasting blood sugar level. Have the screening done: Once every three years after age 62 if you are at a normal weight and have a low risk for diabetes. More often and at a younger age if you are overweight or have a high risk for diabetes. What should I know about preventing infection? Hepatitis B If you have a higher risk for hepatitis B, you should be screened for this virus. Talk with your health care provider to find out if you are at risk for hepatitis B infection. Hepatitis C Testing is recommended for: Everyone born from [birth year] through 1965. Anyone with known risk factors for hepatitis C. Sexually transmitted infections (STIs) Get screened for STIs, including gonorrhea and chlamydia, if: You are sexually active and are younger than 53 years of age. You are older than 53 years of age and your health care provider tells you that you are at risk for this type of infection. Your sexual activity has changed since you were last screened, and you are at increased risk for chlamydia or gonorrhea. Ask your health care provider if  you are at risk. Ask your health care provider about whether you are at high risk for HIV. Your health care provider may recommend a prescription medicine to help prevent HIV infection. If you choose to take medicine to prevent HIV, you should first get tested for HIV. You should then be tested every 3 months for as long as you  are taking the medicine. Osteoporosis and menopause Osteoporosis is a disease in which the bones lose minerals and strength with aging. This can result in bone fractures. If you are 72 years old or older, or if you are at risk for osteoporosis and fractures, ask your health care provider if you should: Be screened for bone loss. Take a calcium  or vitamin D supplement to lower your risk of fractures. Be given hormone replacement therapy (HRT) to treat symptoms of menopause. Follow these instructions at home: Alcohol use Do not drink alcohol if: Your health care provider tells you not to drink. You are pregnant, may be pregnant, or are planning to become pregnant. If you drink alcohol: Limit how much you have to: 0-1 drink a day. Know how much alcohol is in your drink. In the [place] one drink equals one 12 oz bottle of beer (355 mL), one 5 oz glass of wine (148 mL), or one 1 oz glass of hard liquor (44 mL). Lifestyle Do not use any products that contain nicotine or tobacco. These products include cigarettes, chewing tobacco, and vaping devices, such as e-cigarettes. If you need help quitting, ask your health care provider. Do not use street drugs. Do not share needles. Ask your health care provider for help if you need support or information about quitting drugs. General instructions Schedule regular health, dental, and eye exams. Stay current with your vaccines. Tell your health care provider if: You often feel depressed. You have ever been abused or do not feel safe at home. Summary Adopting a healthy lifestyle and getting preventive care are important in promoting health and wellness. Follow your health care provider's instructions about healthy diet, exercising, and getting tested or screened for diseases. Follow your health care provider's instructions on monitoring your cholesterol and blood pressure. This information is not intended to replace advice given to you by your health  care provider. Make sure you discuss any questions you have with your health care provider. Document Revised: 05/29/2020 Document Reviewed: 05/29/2020 Elsevier Patient Education  2024 [employer].

## 2024-02-03 ENCOUNTER — Other Ambulatory Visit: Payer: Self-pay | Admitting: Obstetrics and Gynecology

## 2024-02-03 DIAGNOSIS — Z1231 Encounter for screening mammogram for malignant neoplasm of breast: Secondary | ICD-10-CM

## 2024-03-19 ENCOUNTER — Ambulatory Visit
